# Patient Record
Sex: Female | Born: 1990
Health system: Southern US, Community
[De-identification: ages and names within clinical notes are randomized; demographics above are authoritative.]

## PROBLEM LIST (undated history)

## (undated) DIAGNOSIS — K429 Umbilical hernia without obstruction or gangrene: Secondary | ICD-10-CM

---

## 2016-04-08 ENCOUNTER — Emergency Department (HOSPITAL_BASED_OUTPATIENT_CLINIC_OR_DEPARTMENT_OTHER)
Admission: EM | Admit: 2016-04-08 | Discharge: 2016-04-08 | Disposition: A | Discharge status: Home / Self Care | Payer: Self-pay | Attending: Dermatology | Admitting: Dermatology

## 2016-04-08 ENCOUNTER — Encounter (HOSPITAL_BASED_OUTPATIENT_CLINIC_OR_DEPARTMENT_OTHER): Payer: Self-pay | Admitting: *Deleted

## 2016-04-08 DIAGNOSIS — M25512 Pain in left shoulder: Secondary | ICD-10-CM | POA: Insufficient documentation

## 2016-04-08 DIAGNOSIS — Z5321 Procedure and treatment not carried out due to patient leaving prior to being seen by health care provider: Secondary | ICD-10-CM | POA: Insufficient documentation

## 2016-04-08 NOTE — ED Triage Notes (Signed)
Pt c/o left shoulder pain w/o injury x 2 weeks, seen at [place] [facility] for same x 2

## 2016-04-08 NOTE — ED Notes (Signed)
No answer x 2 calls

## 2016-10-25 ENCOUNTER — Encounter: Payer: Self-pay | Admitting: *Deleted

## 2016-10-25 ENCOUNTER — Telehealth: Payer: Self-pay | Admitting: Family Medicine

## 2016-10-25 ENCOUNTER — Emergency Department (INDEPENDENT_AMBULATORY_CARE_PROVIDER_SITE_OTHER)
Admission: EM | Admit: 2016-10-25 | Discharge: 2016-10-25 | Disposition: A | Discharge status: Home / Self Care | Payer: PRIVATE HEALTH INSURANCE | Source: Home / Self Care | Attending: Family Medicine | Admitting: Family Medicine

## 2016-10-25 ENCOUNTER — Emergency Department (INDEPENDENT_AMBULATORY_CARE_PROVIDER_SITE_OTHER): Payer: PRIVATE HEALTH INSURANCE

## 2016-10-25 DIAGNOSIS — M62838 Other muscle spasm: Secondary | ICD-10-CM

## 2016-10-25 DIAGNOSIS — M62830 Muscle spasm of back: Secondary | ICD-10-CM

## 2016-10-25 DIAGNOSIS — G8929 Other chronic pain: Secondary | ICD-10-CM | POA: Diagnosis not present

## 2016-10-25 DIAGNOSIS — M25512 Pain in left shoulder: Secondary | ICD-10-CM

## 2016-10-25 DIAGNOSIS — M4182 Other forms of scoliosis, cervical region: Secondary | ICD-10-CM

## 2016-10-25 DIAGNOSIS — M249 Joint derangement, unspecified: Secondary | ICD-10-CM | POA: Insufficient documentation

## 2016-10-25 DIAGNOSIS — M899 Disorder of bone, unspecified: Secondary | ICD-10-CM | POA: Insufficient documentation

## 2016-10-25 DIAGNOSIS — M549 Dorsalgia, unspecified: Secondary | ICD-10-CM | POA: Diagnosis not present

## 2016-10-25 DIAGNOSIS — M542 Cervicalgia: Secondary | ICD-10-CM

## 2016-10-25 DIAGNOSIS — S43002A Unspecified subluxation of left shoulder joint, initial encounter: Secondary | ICD-10-CM | POA: Insufficient documentation
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
## 2016-10-25 NOTE — Telephone Encounter (Signed)
Seen patient briefly in urgent care.  Significant scapula dysfunction, Cspine spasm and left shoulder voluntary sublexation.  Plan for scheduling a formal visit with me and refer for PT.

## 2016-10-26 ENCOUNTER — Encounter: Payer: PRIVATE HEALTH INSURANCE | Admitting: Family Medicine

## 2016-10-26 DIAGNOSIS — Z0189 Encounter for other specified special examinations: Secondary | ICD-10-CM

## 2016-11-06 ENCOUNTER — Ambulatory Visit: Payer: PRIVATE HEALTH INSURANCE | Admitting: Rehabilitative and Restorative Service Providers"

## 2016-12-21 ENCOUNTER — Encounter (HOSPITAL_BASED_OUTPATIENT_CLINIC_OR_DEPARTMENT_OTHER): Payer: Self-pay

## 2016-12-21 ENCOUNTER — Emergency Department (HOSPITAL_BASED_OUTPATIENT_CLINIC_OR_DEPARTMENT_OTHER)
Admission: EM | Admit: 2016-12-21 | Discharge: 2016-12-21 | Discharge status: Against Medical Advice | Payer: PRIVATE HEALTH INSURANCE | Attending: Emergency Medicine | Admitting: Emergency Medicine

## 2016-12-21 DIAGNOSIS — H9202 Otalgia, left ear: Secondary | ICD-10-CM | POA: Insufficient documentation

## 2016-12-21 DIAGNOSIS — Z5320 Procedure and treatment not carried out because of patient's decision for unspecified reasons: Secondary | ICD-10-CM | POA: Insufficient documentation

## 2016-12-21 DIAGNOSIS — R42 Dizziness and giddiness: Secondary | ICD-10-CM | POA: Insufficient documentation

## 2016-12-21 NOTE — ED Notes (Signed)
Pt not in room when Dr. Eudelia Bunchardama went to assess. Pt left prior to RN assessment

## 2016-12-21 NOTE — ED Provider Notes (Signed)
[facility]   CSN: [id number] Arrival date & time: 12/21/16  1119     History   Chief Complaint Chief Complaint  Patient presents with  . Otalgia    HPI Mary Gripbresha Berka is a 26 y.o. female.  Patient is a 26 yo F here with L ear pain. Has had for past 2 days feels like there is "a bug or something to drain" and pain is constant. Has been using bobby pin and was getting some "stuff" out initially. No discharge or redness. No dental pain or jaw pain/popping. No fever/chills. No recent illness.      History reviewed. No pertinent past medical history.  Patient Active Problem List   Diagnosis Date Noted  . 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 Cervical paraspinal muscle spasm 10/25/2016    History reviewed. No pertinent surgical history.  OB History    No data available       Home Medications    Prior to Admission medications   Not on File    Family History Family History  Problem Relation Age of Onset  . Diabetes Mother   . COPD Maternal Grandmother   . Stomach cancer Maternal Grandfather     Social History Social History  Substance Use Topics  . Smoking status: Never Smoker  . Smokeless tobacco: Never Used  . Alcohol use No     Allergies   Patient has no known allergies.   Review of Systems Review of Systems  Constitutional: Negative for chills and fever.  HENT: Positive for ear pain (L). Negative for congestion, dental problem, ear discharge, mouth sores, rhinorrhea, sinus pain, sore throat, tinnitus and trouble swallowing.   Respiratory: Negative for shortness of breath.   Cardiovascular: Negative for chest pain.  Gastrointestinal: Negative for abdominal pain, nausea and vomiting.  Genitourinary: Negative for dysuria, frequency, hematuria and urgency.  Skin: Negative for rash.  Neurological: Positive for light-headedness. Negative for dizziness  and headaches.     Physical Exam Updated Vital Signs BP 116/86 (BP Location: Left Arm)   Pulse 82   Temp 98.8 F (37.1 C) (Oral)   Resp 16   Ht 5' (1.524 m)   Wt 49.4 kg (109 lb)   LMP 12/20/2016   SpO2 100%   BMI 21.29 kg/m   Physical Exam  Constitutional: She is oriented to person, place, and time. She appears well-developed and well-nourished. No distress.  HENT:  Head: Normocephalic and atraumatic.  Right Ear: External ear normal.  Left Ear: External ear normal.  Nose: Nose normal.  Mouth/Throat: Oropharynx is clear and moist. No oropharyngeal exudate.  TMs intact pearly gray with good light reflex bilaterally. No fluid or discharge. No erythema. L ear canal with 2  superficial abrasions.  Eyes: Pupils are equal, round, and reactive to light. Conjunctivae and EOM are normal.  Neck: Normal range of motion. Neck supple.  Cardiovascular: Normal rate, regular rhythm, normal heart sounds and intact distal pulses.   No murmur heard. Pulmonary/Chest: Effort normal and breath sounds normal. No respiratory distress.  Abdominal: Soft. Bowel sounds are normal. She exhibits no distension and no mass. There is no tenderness. There is no rebound and no guarding.  Musculoskeletal: Normal range of motion. She exhibits no edema.  Lymphadenopathy:    She has no cervical adenopathy.  Neurological: She is alert and oriented to person, place, and time.  Skin: Skin is warm and dry. Capillary refill takes  less than 2 seconds. Rash noted.  Psychiatric: She has a normal mood and affect.     ED Treatments / Results  Labs (all labs ordered are listed, but only abnormal results are displayed) Labs Reviewed - No data to display  EKG  EKG Interpretation None       Radiology No results found.  Procedures Procedures (including critical care time)  Medications Ordered in ED Medications - No data to display   Initial Impression / Assessment and Plan / ED Course  I have reviewed the  triage vital signs and the nursing notes.  Pertinent labs & imaging results that were available during my care of the patient were reviewed by me and considered in my medical decision making (see chart for details).   Patient is a 26 yo F here with L ear pain. Vitals stable and ear exam is unremarkable as per documentation above. Patient was adamant that her ear needed to be drained, discussed that nothing to drain based on my exam and that she should establish with a PCP. Asked patient to stay for evaluation by attending physician to which she verbally agreed but then she eloped. Discussed my findings with Dr. Eudelia Bunch who agreed with my assessment.    Final Clinical Impressions(s) / ED Diagnoses   Final diagnoses:  Otalgia of left ear    New Prescriptions New Prescriptions   No medications on file     Leland Her, DO 12/21/16 1239    Leland Her, DO 12/21/16 1240

## 2016-12-21 NOTE — ED Notes (Signed)
ED Provider at bedside. Dr. Artist PaisYoo (resident)

## 2016-12-21 NOTE — ED Provider Notes (Signed)
Patient eloped prior to me being able to see and examined the patient. I have reviewed the documentation on PMH/FH/Soc Hx. I have discussed the plan of care with the resident and agree based on her findings.  I have reviewed and agree with the resident's documentation. Please see associated encounter note.   EKG Interpretation None         Petar Mucci, Amadeo GarnetPedro Eduardo, MD 12/21/16 1444

## 2016-12-21 NOTE — ED Notes (Signed)
Pt seen leaving ed by registration staff. 

## 2016-12-21 NOTE — ED Triage Notes (Signed)
C/o left earache x 2 days-started after swimming-NAD-steady gait

## 2017-09-06 ENCOUNTER — Emergency Department (HOSPITAL_BASED_OUTPATIENT_CLINIC_OR_DEPARTMENT_OTHER)
Admission: EM | Admit: 2017-09-06 | Discharge: 2017-09-06 | Disposition: A | Discharge status: Home / Self Care | Payer: PRIVATE HEALTH INSURANCE | Attending: Emergency Medicine | Admitting: Emergency Medicine

## 2017-09-06 ENCOUNTER — Encounter (HOSPITAL_BASED_OUTPATIENT_CLINIC_OR_DEPARTMENT_OTHER): Payer: Self-pay | Admitting: Emergency Medicine

## 2017-09-06 ENCOUNTER — Emergency Department (HOSPITAL_BASED_OUTPATIENT_CLINIC_OR_DEPARTMENT_OTHER): Payer: PRIVATE HEALTH INSURANCE

## 2017-09-06 ENCOUNTER — Other Ambulatory Visit: Payer: Self-pay

## 2017-09-06 DIAGNOSIS — N12 Tubulo-interstitial nephritis, not specified as acute or chronic: Secondary | ICD-10-CM

## 2017-09-06 LAB — CBC WITH DIFFERENTIAL/PLATELET
BASOS ABS: 0 10*3/uL (ref 0.0–0.1)
Basophils Relative: 0 %
EOS PCT: 0 %
Eosinophils Absolute: 0 10*3/uL (ref 0.0–0.7)
HEMATOCRIT: 35.8 % — AB (ref 36.0–46.0)
HEMOGLOBIN: 12.9 g/dL (ref 12.0–15.0)
LYMPHS ABS: 1.3 10*3/uL (ref 0.7–4.0)
LYMPHS PCT: 13 %
MCH: 31.2 pg (ref 26.0–34.0)
MCHC: 36 g/dL (ref 30.0–36.0)
MCV: 86.5 fL (ref 78.0–100.0)
Monocytes Absolute: 1.8 10*3/uL — ABNORMAL HIGH (ref 0.1–1.0)
Monocytes Relative: 18 %
NEUTROS ABS: 7 10*3/uL (ref 1.7–7.7)
Neutrophils Relative %: 69 %
PLATELETS: 224 10*3/uL (ref 150–400)
RBC: 4.14 MIL/uL (ref 3.87–5.11)
RDW: 11.9 % (ref 11.5–15.5)
WBC: 10 10*3/uL (ref 4.0–10.5)

## 2017-09-06 LAB — COMPREHENSIVE METABOLIC PANEL
ALT: 89 U/L — ABNORMAL HIGH (ref 14–54)
ANION GAP: 10 (ref 5–15)
AST: 171 U/L — AB (ref 15–41)
Albumin: 3.5 g/dL (ref 3.5–5.0)
Alkaline Phosphatase: 96 U/L (ref 38–126)
BUN: 5 mg/dL — AB (ref 6–20)
CO2: 21 mmol/L — ABNORMAL LOW (ref 22–32)
Calcium: 8.7 mg/dL — ABNORMAL LOW (ref 8.9–10.3)
Chloride: 101 mmol/L (ref 101–111)
Creatinine, Ser: 0.81 mg/dL (ref 0.44–1.00)
GFR calc Af Amer: >60 mL/min (ref >60)
Glucose, Bld: 132 mg/dL — ABNORMAL HIGH (ref 65–99)
POTASSIUM: 3.4 mmol/L — AB (ref 3.5–5.1)
Sodium: 132 mmol/L — ABNORMAL LOW (ref 135–145)
TOTAL PROTEIN: 7.3 g/dL (ref 6.5–8.1)
Total Bilirubin: 0.8 mg/dL (ref 0.3–1.2)

## 2017-09-06 LAB — URINALYSIS, ROUTINE W REFLEX MICROSCOPIC
Bilirubin Urine: NEGATIVE
GLUCOSE, UA: 100 mg/dL — AB
Ketones, ur: NEGATIVE mg/dL
LEUKOCYTES UA: NEGATIVE
Nitrite: NEGATIVE
PH: 6 (ref 5.0–8.0)
Protein, ur: 100 mg/dL — AB
SPECIFIC GRAVITY, URINE: 1.025 (ref 1.005–1.030)

## 2017-09-06 LAB — URINALYSIS, MICROSCOPIC (REFLEX)

## 2017-09-06 LAB — RAPID STREP SCREEN (MED CTR MEBANE ONLY): Streptococcus, Group A Screen (Direct): NEGATIVE

## 2017-09-06 LAB — I-STAT CG4 LACTIC ACID, ED
LACTIC ACID, VENOUS: 1.96 mmol/L — AB (ref 0.5–1.9)
LACTIC ACID, VENOUS: 1.03 mmol/L (ref 0.5–1.9)

## 2017-09-06 LAB — PREGNANCY, URINE: Preg Test, Ur: NEGATIVE

## 2017-09-06 MED ORDER — SODIUM CHLORIDE 0.9 % IJ SOLN
5.00 | INTRAMUSCULAR | Status: DC
Start: ? — End: 2017-09-06

## 2017-09-06 MED ORDER — SODIUM CHLORIDE 0.9 % IV BOLUS
1000.0000 mL | Freq: Once | INTRAVENOUS | Status: AC
  Administered 2017-09-06: 1000 mL via INTRAVENOUS

## 2017-09-06 MED ORDER — MORPHINE SULFATE (PF) 4 MG/ML IV SOLN
4.0000 mg | Freq: Once | INTRAVENOUS | Status: AC
  Administered 2017-09-06: 4 mg via INTRAVENOUS
  Filled 2017-09-06: qty 1

## 2017-09-06 MED ORDER — SODIUM CHLORIDE 0.9 % IV SOLN
2.0000 g | Freq: Once | INTRAVENOUS | Status: AC
  Administered 2017-09-06: 2 g via INTRAVENOUS
  Filled 2017-09-06: qty 20

## 2017-09-06 MED ORDER — PROMETHAZINE HCL 25 MG/ML IJ SOLN
12.5000 mg | Freq: Once | INTRAMUSCULAR | Status: AC
  Administered 2017-09-06: 12.5 mg via INTRAVENOUS
  Filled 2017-09-06: qty 1

## 2017-09-06 MED ORDER — CEPHALEXIN 500 MG PO CAPS: 500.0000 mg | ORAL_CAPSULE | Freq: Four times a day (QID) | ORAL | 0 refills | Status: AC

## 2017-09-06 MED ORDER — KETOROLAC TROMETHAMINE 30 MG/ML IJ SOLN
30.0000 mg | Freq: Once | INTRAMUSCULAR | Status: AC
  Administered 2017-09-06: 30 mg via INTRAVENOUS
  Filled 2017-09-06: qty 1

## 2017-09-06 MED ORDER — PROMETHAZINE HCL 12.5 MG PO TABS: 25.0000 mg | ORAL_TABLET | Freq: Three times a day (TID) | ORAL | 0 refills | Status: DC | PRN

## 2017-09-06 NOTE — ED Triage Notes (Signed)
LLQ abd pain, headache, n/v, and fever x5 days. Unable to keep anything on her stomcah.  Was seen at [facility] 5 days ago.

## 2017-09-06 NOTE — ED Notes (Signed)
Patient transported to X-ray 

## 2017-09-06 NOTE — ED Notes (Signed)
ED Provider at bedside. 

## 2017-09-06 NOTE — Discharge Instructions (Signed)
Please take Phenergen as needed for nausea and vomiting Please use ibuprofen and tylenol as needed for fever and body aches.  Please take keflex as prescribed. Please take all of your antibiotics until finished!   You may develop abdominal discomfort or diarrhea from the antibiotic.  You may help offset this with probiotics which you can buy or get in yogurt. Do not eat or take the probiotics until 2 hours after your antibiotic. Do not take your medicine if develop an itchy rash, swelling in your mouth or lips, or difficulty breathing.  Please follow up with your PCP this week for further evaluation.  Return if you continue to have vomiting despite anti-nausea medication.  If you develop worsening or new concerning symptoms you can return to the emergency department for re-evaluation.

## 2017-09-06 NOTE — ED Provider Notes (Signed)
[facility] EMERGENCY DEPARTMENT Provider Note   CSN: [id number] Arrival date & time: 09/06/17  1046     History   Chief Complaint Chief Complaint  Patient presents with  . Fever    HPI Mary Peters is a 27 y.o. female who presents the emergency department today for fever x 5 days ago.  Patient states that over the last 5 days she has been having fever, chills, generalized body aches, abdominal pain, urinary symptoms as well as nausea and vomiting.  She notes that her pain is in her left flank and radiates to her left upper abdomen as well as her left lower abdomen.  She reports that this pain waxes and waning throughout the day.  She notes that she has constant nausea and typically has approximately 3 episodes of nonbilious, nonbloody emesis per day.  She notes she has not been able to tolerate fluids over the last several days.  She reports that she has been having increased urinary frequency has since the onset of her symptoms.  She notes that every time she vomits she gets pain in her bilateral temporal area that she describes as a headache but resolves shortly after.  She was seen at [facility] yesterday where she had a extensive work-up done including blood work, urinalysis, CT of the head as well as CT of the abdomen.  She was discharged home with Fioricet and Zofran.  She notes that she has been taking the Zofran for her symptoms but has continued nausea and emesis.  She has not been able to take anything for her fever.  She notes she has had continued symptoms.  Patient denies any URI symptoms, chest pain, shortness of breath.  Abdominal Pain -pertinent negatives Patient denies any bloody or bilious emesis.  No diarrhea, melena, hematochezia, dysuria, hematuria, urinary urgency.  Last bowel movement today normal.  No previous abdominal surgeries.  Patient states she has not been sexually active in the last 6 months.  No vaginal discharge or bleeding.  No pelvic  pain.  Headache pertinent negatives Patient denies any syncope, visual changes, inability to range the neck, rash, or thunderclap onset.  HPI  History reviewed. No pertinent past medical history.  Patient Active Problem List   Diagnosis Date Noted  . 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 Cervical paraspinal muscle spasm 10/25/2016    History reviewed. No pertinent surgical history.   OB History   None      Home Medications    Prior to Admission medications   Not on File    Family History Family History  Problem Relation Age of Onset  . Diabetes Mother   . COPD Maternal Grandmother   . Stomach cancer Maternal Grandfather     Social History Social History   Tobacco Use  . Smoking status: Never Smoker  . Smokeless tobacco: Never Used  Substance Use Topics  . Alcohol use: No  . Drug use: No     Allergies   Patient has no known allergies.   Review of Systems Review of Systems  All other systems reviewed and are negative.    Physical Exam Updated Vital Signs BP 116/75 (BP Location: Right Arm)   Pulse (!) 102   Temp (!) 103.2 F (39.6 C) (Oral)   Resp 18   Ht 5' (1.524 m)   Wt 51.3 kg (113 lb)   LMP 08/24/2017   SpO2 100%   BMI  22.07 kg/m   Physical Exam  Constitutional: She appears well-developed and well-nourished.  Nontoxic-appearing  HENT:  Head: Normocephalic and atraumatic.  Right Ear: External ear normal.  Left Ear: External ear normal.  Nose: Nose normal.  Mouth/Throat: Uvula is midline, oropharynx is clear and moist and mucous membranes are normal. No tonsillar exudate.  The patient has normal phonation and is in control of secretions. No stridor.  Midline uvula without edema. Soft palate rises symmetrically.  No tonsillar erythema or exudates. No PTA. Tongue protrusion is normal. No trismus. No creptius on neck palpation and patient has good dentition. No  gingival erythema or fluctuance noted. Mucus membranes moist.   Eyes: Pupils are equal, round, and reactive to light. Right eye exhibits no discharge. Left eye exhibits no discharge. No scleral icterus. Right eye exhibits normal extraocular motion and no nystagmus. Left eye exhibits normal extraocular motion and no nystagmus.  Neck: Trachea normal. Neck supple. No spinous process tenderness present. No neck rigidity. Normal range of motion present.  No nuchal rigidity or meningismus. Full rom of the neck without difficulty.   Cardiovascular: Normal rate, regular rhythm and intact distal pulses.  No murmur heard. Pulses:      Radial pulses are 2+ on the right side, and 2+ on the left side.       Dorsalis pedis pulses are 2+ on the right side, and 2+ on the left side.       Posterior tibial pulses are 2+ on the right side, and 2+ on the left side.  No lower extremity swelling or edema. Calves symmetric in size bilaterally.  Pulmonary/Chest: Effort normal and breath sounds normal. She exhibits no tenderness.  Patient satting at 100% on room air. No increased work of breathing. No accessory muscle use. Patient is sitting upright, speaking in full sentences without difficulty   Abdominal: Soft. Bowel sounds are normal. She exhibits no distension and no pulsatile liver. There is tenderness in the left upper quadrant and left lower quadrant. There is CVA tenderness (left). There is no rigidity, no rebound, no guarding, no tenderness at McBurney's point and negative Murphy's sign.  Musculoskeletal: She exhibits no edema.  Lymphadenopathy:    She has no cervical adenopathy.  Neurological: She is alert.  Mental Status:  Alert, oriented, thought content appropriate, able to give a coherent history. Speech fluent without evidence of aphasia. Able to follow 2 step commands without difficulty.  Cranial Nerves:  II:  Peripheral visual fields grossly normal, pupils equal, round, reactive to light III,IV, VI:  ptosis not present, extra-ocular motions intact bilaterally  V,VII: smile symmetric, eyebrows raise symmetric, facial light touch sensation equal VIII: hearing grossly normal to voice  X: uvula elevates symmetrically  XI: bilateral shoulder shrug symmetric and strong XII: midline tongue extension without fassiculations Motor:  Normal tone. 5/5 in upper and lower extremities bilaterally including strong and equal grip strength and dorsiflexion/plantar flexion Sensory: Sensation intact to light touch in all extremities. Negative Romberg.  Deep Tendon Reflexes: 2+ and symmetric in the biceps and patella Cerebellar: normal finger-to-nose with bilateral upper extremities. Normal heel-to -shin balance bilaterally of the lower extremity. No pronator drift.  CV: distal pulses palpable throughout   Skin: Skin is warm and dry. No rash noted. She is not diaphoretic.  No ocular papular rash, no petechiae, no purpura, no blisters, no pustules, no warmth, no draining sinus tracts, no superficial abscesses, no bullous impetigo, no vesicles, no desquamation, no target lesions with dusky purpura or a central  bulla.   Psychiatric: She has a normal mood and affect.  Nursing note and vitals reviewed.    ED Treatments / Results  Labs (all labs ordered are listed, but only abnormal results are displayed) Labs Reviewed  COMPREHENSIVE METABOLIC PANEL - Abnormal; Notable for the following components:      Result Value   Sodium 132 (*)    Potassium 3.4 (*)    CO2 21 (*)    Glucose, Bld 132 (*)    BUN 5 (*)    Calcium 8.7 (*)    AST 171 (*)    ALT 89 (*)    All other components within normal limits  CBC WITH DIFFERENTIAL/PLATELET - Abnormal; Notable for the following components:   HCT 35.8 (*)    Monocytes Absolute 1.8 (*)    All other components within normal limits  URINALYSIS, ROUTINE W REFLEX MICROSCOPIC - Abnormal; Notable for the following components:   APPearance CLOUDY (*)    Glucose, UA 100 (*)     Hgb urine dipstick SMALL (*)    Protein, ur 100 (*)    All other components within normal limits  URINALYSIS, MICROSCOPIC (REFLEX) - Abnormal; Notable for the following components:   Bacteria, UA FEW (*)    All other components within normal limits  I-STAT CG4 LACTIC ACID, ED - Abnormal; Notable for the following components:   Lactic Acid, Venous 1.96 (*)    All other components within normal limits  RAPID STREP SCREEN ([facility] ONLY)  CULTURE, GROUP A STREP ([facility])  URINE CULTURE  PREGNANCY, URINE  I-STAT CG4 LACTIC ACID, ED    EKG None  Radiology Dg Chest 2 View  Result Date: 09/06/2017 CLINICAL DATA:  Left lower quadrant abdominal pain, headache, nausea and vomiting, and fever for 5 days. EXAM: CHEST - 2 VIEW COMPARISON:  The cardiomediastinal silhouette is within normal limits. The lungs are well inflated and clear. There is no evidence of pleural effusion or pneumothorax. No acute osseous abnormality is identified. FINDINGS: The heart size and mediastinal contours are within normal limits. Both lungs are clear. The visualized skeletal structures are unremarkable. IMPRESSION: No active cardiopulmonary disease. Electronically Signed   By: Sebastian Ache M.D.   On: 09/06/2017 11:16    Procedures Procedures (including critical care time)  Medications Ordered in ED Medications  morphine 4 MG/ML injection 4 mg (4 mg Intravenous Given 09/06/17 1146)  ketorolac (TORADOL) 30 MG/ML injection 30 mg (30 mg Intravenous Given 09/06/17 1143)  sodium chloride 0.9 % bolus 1,000 mL (0 mLs Intravenous Stopped 09/06/17 1235)  promethazine (PHENERGAN) injection 12.5 mg (12.5 mg Intravenous Given 09/06/17 1142)  sodium chloride 0.9 % bolus 1,000 mL (0 mLs Intravenous Stopped 09/06/17 1403)  cefTRIAXone (ROCEPHIN) 2 g in sodium chloride 0.9 % 100 mL IVPB (0 g Intravenous Stopped 09/06/17 1232)     Initial Impression / Assessment and Plan / ED Course  I have reviewed the triage vital signs and the nursing  notes.  Pertinent labs & imaging results that were available during my care of the patient were reviewed by me and considered in my medical decision making (see chart for details).     27 year old female that presents with fever, left flank pain that radiates into her left lower abdomen, urinary frequency, nausea and vomiting over the last several days.  Patient was seen at [facility] yesterday where she had a CT scan done that shows pyelonephritis.  Urine did show possible UTI.  She was  not discharged home on antibiotics.  She was discharged home with Zofran.  Patient is presenting because she has had ongoing nausea and vomiting despite Zofran therapy.  She is noted to be febrile and tachycardic on presentation.  Patient does not appear septic.  Lactic acid 1.96.  Likely liver related to ongoing nausea and vomiting.  His work-up is consistent with pyelonephritis.  She was given IV fluids and nausea medication in the department with significant improvement of her symptoms.  Vital signs improved after IV fluids and Toradol.  Patient is tolerating p.o. fluids.  Urine sent for culture.  She was given dose of IV Rocephin in the department.  Do not feel there is any other emergent process that would require ongoing work-up or inpatient treatments at this time.  Patient will be discharged home on 7-day course of Keflex.  Will give Phenergan for nausea and the patient not to take Zofran at the same time as this.  Recommended ibuprofen and Tylenol as needed for fever and body aches. . I advised the patient to follow-up with PCP this week. Specific return precautions discussed. Time was given for all questions to be answered. The patient verbalized understanding and agreement with plan. The patient appears safe for discharge home.  Patient case discussed with Dr. Patria Mane who is in agreement with plan.  Vitals:   09/06/17 1054 09/06/17 1055 09/06/17 1234 09/06/17 1402  BP: 116/75  101/68   Pulse: (!) 102   98   Resp: 18  16   Temp: (!) 103.2 F (39.6 C)  100.1 F (37.8 C) 98.7 F (37.1 C)  TempSrc: Oral  Oral Oral  SpO2: 100%  99%   Weight:  51.3 kg (113 lb)    Height:  5' (1.524 m)      Final Clinical Impressions(s) / ED Diagnoses   Final diagnoses:  Pyelonephritis    ED Discharge Orders        Ordered    promethazine (PHENERGAN) 12.5 MG tablet  Every 8 hours PRN     09/06/17 1354    cephALEXin (KEFLEX) 500 MG capsule  4 times daily     09/06/17 1354       Princella Pellegrini 09/06/17 1432    Azalia Bilis, MD 09/06/17 1701

## 2017-09-07 LAB — URINE CULTURE: Culture: <10000 — AB

## 2017-09-08 LAB — CULTURE, GROUP A STREP (THRC)

## 2019-07-04 ENCOUNTER — Encounter (HOSPITAL_BASED_OUTPATIENT_CLINIC_OR_DEPARTMENT_OTHER): Payer: Self-pay

## 2019-07-04 ENCOUNTER — Other Ambulatory Visit: Payer: Self-pay

## 2019-07-04 ENCOUNTER — Emergency Department (HOSPITAL_BASED_OUTPATIENT_CLINIC_OR_DEPARTMENT_OTHER)
Admission: EM | Admit: 2019-07-04 | Discharge: 2019-07-04 | Disposition: A | Discharge status: Home / Self Care | Payer: PRIVATE HEALTH INSURANCE | Attending: Emergency Medicine | Admitting: Emergency Medicine

## 2019-07-04 DIAGNOSIS — N939 Abnormal uterine and vaginal bleeding, unspecified: Secondary | ICD-10-CM | POA: Insufficient documentation

## 2019-07-04 DIAGNOSIS — R110 Nausea: Secondary | ICD-10-CM

## 2019-07-04 DIAGNOSIS — R112 Nausea with vomiting, unspecified: Secondary | ICD-10-CM | POA: Insufficient documentation

## 2019-07-04 LAB — COMPREHENSIVE METABOLIC PANEL
ALT: 14 U/L (ref 0–44)
AST: 20 U/L (ref 15–41)
Albumin: 4.3 g/dL (ref 3.5–5.0)
Alkaline Phosphatase: 46 U/L (ref 38–126)
Anion gap: 10 (ref 5–15)
BUN: 8 mg/dL (ref 6–20)
CO2: 24 mmol/L (ref 22–32)
Calcium: 9 mg/dL (ref 8.9–10.3)
Chloride: 108 mmol/L (ref 98–111)
Creatinine, Ser: 0.7 mg/dL (ref 0.44–1.00)
GFR calc Af Amer: >60 mL/min (ref >60)
GFR calc non Af Amer: >60 mL/min (ref >60)
Glucose, Bld: 129 mg/dL — ABNORMAL HIGH (ref 70–99)
Potassium: 3.2 mmol/L — ABNORMAL LOW (ref 3.5–5.1)
Sodium: 142 mmol/L (ref 135–145)
Total Bilirubin: 0.2 mg/dL — ABNORMAL LOW (ref 0.3–1.2)
Total Protein: 7.6 g/dL (ref 6.5–8.1)

## 2019-07-04 LAB — CBC WITH DIFFERENTIAL/PLATELET
Abs Immature Granulocytes: 0.04 10*3/uL (ref 0.00–0.07)
Basophils Absolute: 0 10*3/uL (ref 0.0–0.1)
Basophils Relative: 0 %
Eosinophils Absolute: 0.1 10*3/uL (ref 0.0–0.5)
Eosinophils Relative: 1 %
HCT: 42.2 % (ref 36.0–46.0)
Hemoglobin: 13.6 g/dL (ref 12.0–15.0)
Immature Granulocytes: 1 %
Lymphocytes Relative: 19 %
Lymphs Abs: 1.7 10*3/uL (ref 0.7–4.0)
MCH: 29.6 pg (ref 26.0–34.0)
MCHC: 32.2 g/dL (ref 30.0–36.0)
MCV: 91.7 fL (ref 80.0–100.0)
Monocytes Absolute: 0.6 10*3/uL (ref 0.1–1.0)
Monocytes Relative: 6 %
Neutro Abs: 6.2 10*3/uL (ref 1.7–7.7)
Neutrophils Relative %: 73 %
Platelets: 280 10*3/uL (ref 150–400)
RBC: 4.6 MIL/uL (ref 3.87–5.11)
RDW: 12.9 % (ref 11.5–15.5)
WBC: 8.6 10*3/uL (ref 4.0–10.5)
nRBC: 0 % (ref 0.0–0.2)

## 2019-07-04 LAB — URINALYSIS, ROUTINE W REFLEX MICROSCOPIC
Bilirubin Urine: NEGATIVE
Glucose, UA: NEGATIVE mg/dL
Ketones, ur: NEGATIVE mg/dL
Leukocytes,Ua: NEGATIVE
Nitrite: NEGATIVE
Protein, ur: NEGATIVE mg/dL
Specific Gravity, Urine: >1.03 — ABNORMAL HIGH (ref 1.005–1.030)
pH: 6 (ref 5.0–8.0)

## 2019-07-04 LAB — URINALYSIS, MICROSCOPIC (REFLEX)

## 2019-07-04 LAB — RAPID URINE DRUG SCREEN, HOSP PERFORMED
Amphetamines: NOT DETECTED
Barbiturates: NOT DETECTED
Benzodiazepines: NOT DETECTED
Cocaine: NOT DETECTED
Opiates: NOT DETECTED
Tetrahydrocannabinol: POSITIVE — AB

## 2019-07-04 LAB — PREGNANCY, URINE: Preg Test, Ur: NEGATIVE

## 2019-07-04 LAB — LIPASE, BLOOD: Lipase: 31 U/L (ref 11–51)

## 2019-07-04 MED ORDER — ONDANSETRON HCL 4 MG PO TABS: 4.0000 mg | ORAL_TABLET | Freq: Four times a day (QID) | ORAL | 0 refills | Status: AC

## 2019-07-04 MED ORDER — POTASSIUM CHLORIDE 10 MEQ/100ML IV SOLN
10.0000 meq | Freq: Once | INTRAVENOUS | Status: AC
  Administered 2019-07-04: 08:00:00 10 meq via INTRAVENOUS
  Filled 2019-07-04: qty 100

## 2019-07-04 MED ORDER — PROCHLORPERAZINE EDISYLATE 10 MG/2ML IJ SOLN: 10.0000 mg | Freq: Once | INTRAMUSCULAR | Status: DC

## 2019-07-04 MED ORDER — SODIUM CHLORIDE 0.9 % IV BOLUS
1000.0000 mL | Freq: Once | INTRAVENOUS | Status: AC
  Administered 2019-07-04: 08:00:00 1000 mL via INTRAVENOUS

## 2019-07-04 MED ORDER — FENTANYL CITRATE (PF) 100 MCG/2ML IJ SOLN
50.0000 ug | Freq: Once | INTRAMUSCULAR | Status: AC
  Administered 2019-07-04: 08:00:00 50 ug via INTRAVENOUS
  Filled 2019-07-04: qty 2

## 2019-07-04 MED ORDER — ONDANSETRON HCL 4 MG/2ML IJ SOLN
4.0000 mg | Freq: Once | INTRAMUSCULAR | Status: AC
  Administered 2019-07-04: 08:00:00 4 mg via INTRAVENOUS
  Filled 2019-07-04: qty 2

## 2019-07-04 MED ORDER — DIPHENHYDRAMINE HCL 50 MG/ML IJ SOLN: 25.0000 mg | Freq: Once | INTRAMUSCULAR | Status: DC

## 2019-07-04 MED ORDER — SODIUM CHLORIDE 0.9 % IV BOLUS: 1000.0000 mL | Freq: Once | INTRAVENOUS | Status: DC

## 2019-07-04 MED FILL — ONDANSETRON HCL 4 MG TABLET: 4 | 5 days supply | Qty: 20 | Fill #0

## 2019-07-04 NOTE — Discharge Instructions (Addendum)
Continue Zofran as needed for nausea and vomiting.  Continue to take Tylenol Motrin for pain.  Stay hydrated.  Keep a simple diet and avoid fatty and greasy foods.  Return if symptoms worsen.

## 2019-07-04 NOTE — ED Notes (Signed)
ED Provider at bedside. 

## 2019-07-04 NOTE — ED Provider Notes (Signed)
[facility] EMERGENCY DEPARTMENT Provider Note   CSN: [id number] Arrival date & time: 07/04/19  8850     History Chief Complaint  Patient presents with  . Abdominal Pain    Mary Peters is a 29 y.o. female.  The history is provided by the patient.  Abdominal Pain Pain location:  Generalized Pain quality: aching and cramping   Pain radiates to:  Does not radiate Pain severity:  Mild Onset quality:  Gradual Timing:  Constant Progression:  Unchanged Chronicity:  New Context: not previous surgeries, not recent illness, not sick contacts and not suspicious food intake   Relieved by:  Nothing Worsened by:  Nothing Associated symptoms: nausea (with blood in vomit), vaginal bleeding (started menstrual cycle yesterday) and vomiting   Associated symptoms: no chest pain, no chills, no cough, no dysuria, no fever, no hematuria, no shortness of breath, no sore throat and no vaginal discharge   Risk factors: has not had multiple surgeries and not pregnant        History reviewed. No pertinent past medical history.  Patient Active Problem List   Diagnosis Date Noted  . 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 Cervical paraspinal muscle spasm 10/25/2016    History reviewed. No pertinent surgical history.   OB History   No obstetric history on file.     Family History  Problem Relation Age of Onset  . Diabetes Mother   . COPD Maternal Grandmother   . Stomach cancer Maternal Grandfather     Social History   Tobacco Use  . Smoking status: Never Smoker  . Smokeless tobacco: Never Used  Substance Use Topics  . Alcohol use: No  . Drug use: No    Home Medications Prior to Admission medications   Medication Sig Start Date End Date Taking? Authorizing Provider  ondansetron (ZOFRAN) 4 MG tablet Take 1 tablet (4 mg total) by mouth every 6 (six) hours for 20 doses. 07/04/19 07/09/19  Tishawn Friedhoff,  Martiza Speth, DO  promethazine (PHENERGAN) 12.5 MG tablet Take 2 tablets (25 mg total) by mouth every 8 (eight) hours as needed for nausea or vomiting. 09/06/17   Maczis, Elmer Sow, PA-C    Allergies    Patient has no known allergies.  Review of Systems   Review of Systems  Constitutional: Negative for chills and fever.  HENT: Negative for ear pain and sore throat.   Eyes: Negative for pain and visual disturbance.  Respiratory: Negative for cough and shortness of breath.   Cardiovascular: Negative for chest pain and palpitations.  Gastrointestinal: Positive for abdominal pain, nausea (with blood in vomit) and vomiting.  Genitourinary: Positive for vaginal bleeding (started menstrual cycle yesterday). Negative for dysuria, hematuria, vaginal discharge and vaginal pain.  Musculoskeletal: Negative for arthralgias and back pain.  Skin: Negative for color change and rash.  Neurological: Negative for seizures and syncope.  All other systems reviewed and are negative.   Physical Exam Updated Vital Signs  ED Triage Vitals  Enc Vitals Group     BP 07/04/19 0724 (!) 138/103     Pulse Rate 07/04/19 0724 75     Resp 07/04/19 0724 20     Temp 07/04/19 0724 98.3 F (36.8 C)     Temp Source 07/04/19 0724 Oral     SpO2 07/04/19 0724 98 %     Weight 07/04/19 0725 110 lb (49.9 kg)     Height 07/04/19 0725 5' (1.524 m)  Head Circumference --      Peak Flow --      Pain Score 07/04/19 0725 10     Pain Loc --      Pain Edu? --      Excl. in GC? --     Physical Exam Vitals and nursing note reviewed.  Constitutional:      General: She is not in acute distress.    Appearance: She is well-developed. She is not ill-appearing.  HENT:     Head: Normocephalic and atraumatic.     Nose: Nose normal.     Mouth/Throat:     Mouth: Mucous membranes are moist.  Eyes:     Extraocular Movements: Extraocular movements intact.     Conjunctiva/sclera: Conjunctivae normal.     Pupils: Pupils are equal,  round, and reactive to light.  Cardiovascular:     Rate and Rhythm: Normal rate and regular rhythm.     Pulses: Normal pulses.     Heart sounds: Normal heart sounds. No murmur.  Pulmonary:     Effort: Pulmonary effort is normal. No respiratory distress.     Breath sounds: Normal breath sounds.  Abdominal:     General: There is no distension.     Palpations: Abdomen is soft.     Tenderness: There is generalized abdominal tenderness. There is no right CVA tenderness, left CVA tenderness, guarding or rebound.  Musculoskeletal:     Cervical back: Neck supple.  Skin:    General: Skin is warm and dry.  Neurological:     General: No focal deficit present.     Mental Status: She is alert.     ED Results / Procedures / Treatments   Labs (all labs ordered are listed, but only abnormal results are displayed) Labs Reviewed  COMPREHENSIVE METABOLIC PANEL - Abnormal; Notable for the following components:      Result Value   Potassium 3.2 (*)    Glucose, Bld 129 (*)    Total Bilirubin 0.2 (*)    All other components within normal limits  URINALYSIS, ROUTINE W REFLEX MICROSCOPIC - Abnormal; Notable for the following components:   Specific Gravity, Urine >1.030 (*)    Hgb urine dipstick MODERATE (*)    All other components within normal limits  RAPID URINE DRUG SCREEN, HOSP PERFORMED - Abnormal; Notable for the following components:   Tetrahydrocannabinol POSITIVE (*)    All other components within normal limits  URINALYSIS, MICROSCOPIC (REFLEX) - Abnormal; Notable for the following components:   Bacteria, UA FEW (*)    All other components within normal limits  LIPASE, BLOOD  CBC WITH DIFFERENTIAL/PLATELET  PREGNANCY, URINE    EKG None  Radiology No results found.  Procedures Procedures (including critical care time)  Medications Ordered in ED Medications  potassium chloride 10 mEq in 100 mL IVPB (10 mEq Intravenous New Bag/Given 07/04/19 0828)  sodium chloride 0.9 % bolus  1,000 mL ( Intravenous Stopped 07/04/19 0848)  ondansetron (ZOFRAN) injection 4 mg (4 mg Intravenous Given 07/04/19 0746)  fentaNYL (SUBLIMAZE) injection 50 mcg (50 mcg Intravenous Given 07/04/19 0747)    ED Course  I have reviewed the triage vital signs and the nursing notes.  Pertinent labs & imaging results that were available during my care of the patient were reviewed by me and considered in my medical decision making (see chart for details).    MDM Rules/Calculators/A&P  Brittin Belnap is a 29 year old female with no significant medical history who presents to the emergency department with abdominal pain, nausea, vomiting.  Patient with unremarkable vitals.  No fever.  Symptoms started last night.  Patient with some cramping and diffuse abdominal pain with nausea and vomiting.  Possibly some blood within the vomit.  Denies any alcohol or drug use.  Started menstrual cycle yesterday.  Otherwise denies any vaginal discharge.  Denies any trauma or suspicious food intake.  Has some mild abdominal pain throughout but no focal tenderness on exam.  Denies any urinary symptoms.  However will check lab work, urinalysis.  Will give IV fluids, IV Zofran, IV fentanyl and reevaluate.  Low concern for appendicitis or bowel obstruction at this time.  Suspect possibly viral process or foodborne process.  Patient feels improved following fluids, antiemetics and pain medicine.  No significant anemia, electrolyte abnormality, kidney injury.  No urinary tract infection.  Gallbladder and liver enzymes within normal limits.  Doubt cholecystitis.  Repeat abdominal exam is unremarkable.  No focal pain on exam to suggest intra-abdominal process such as appendicitis or bowel obstruction.  Suspect viral process versus foodborne process.  Possibly related to menstrual cycle.  UDS also positive for THC and possibly an aspect of hyperemesis from [facility].  Will prescribe Zofran.  Given return precautions and  discharged from the ED in good condition.  This chart was dictated using voice recognition software.  Despite best efforts to proofread,  errors can occur which can change the documentation meaning.    Final Clinical Impression(s) / ED Diagnoses Final diagnoses:  Nausea    Rx / DC Orders ED Discharge Orders         Ordered    ondansetron (ZOFRAN) 4 MG tablet  Every 6 hours     07/04/19 0925           Lennice Sites, DO 07/04/19 [phone]

## 2019-07-04 NOTE — ED Triage Notes (Signed)
Pt arrives with c/o abdominal pain, states she did start her menstrual cycle yesterday but reports this pain is worse than her normal. Also reports vomiting blood starting yesterday. Pt reports about 10 episodes of vomiting. Denies any medication at home.

## 2019-09-13 ENCOUNTER — Encounter (HOSPITAL_BASED_OUTPATIENT_CLINIC_OR_DEPARTMENT_OTHER): Payer: Self-pay

## 2019-09-13 ENCOUNTER — Emergency Department (HOSPITAL_BASED_OUTPATIENT_CLINIC_OR_DEPARTMENT_OTHER)
Admission: EM | Admit: 2019-09-13 | Discharge: 2019-09-13 | Disposition: A | Discharge status: Home / Self Care | Payer: Self-pay | Attending: Emergency Medicine | Admitting: Emergency Medicine

## 2019-09-13 ENCOUNTER — Other Ambulatory Visit: Payer: Self-pay

## 2019-09-13 DIAGNOSIS — R1012 Left upper quadrant pain: Secondary | ICD-10-CM | POA: Insufficient documentation

## 2019-09-13 DIAGNOSIS — R112 Nausea with vomiting, unspecified: Secondary | ICD-10-CM | POA: Insufficient documentation

## 2019-09-13 LAB — RAPID URINE DRUG SCREEN, HOSP PERFORMED
Amphetamines: NOT DETECTED
Barbiturates: NOT DETECTED
Benzodiazepines: NOT DETECTED
Cocaine: NOT DETECTED
Opiates: NOT DETECTED
Tetrahydrocannabinol: POSITIVE — AB

## 2019-09-13 LAB — CBC WITH DIFFERENTIAL/PLATELET
Abs Immature Granulocytes: 0.03 10*3/uL (ref 0.00–0.07)
Basophils Absolute: 0 10*3/uL (ref 0.0–0.1)
Basophils Relative: 0 %
Eosinophils Absolute: 0 10*3/uL (ref 0.0–0.5)
Eosinophils Relative: 0 %
HCT: 36.9 % (ref 36.0–46.0)
Hemoglobin: 12.2 g/dL (ref 12.0–15.0)
Immature Granulocytes: 0 %
Lymphocytes Relative: 14 %
Lymphs Abs: 1.1 10*3/uL (ref 0.7–4.0)
MCH: 29.5 pg (ref 26.0–34.0)
MCHC: 33.1 g/dL (ref 30.0–36.0)
MCV: 89.1 fL (ref 80.0–100.0)
Monocytes Absolute: 0.5 10*3/uL (ref 0.1–1.0)
Monocytes Relative: 6 %
Neutro Abs: 6.6 10*3/uL (ref 1.7–7.7)
Neutrophils Relative %: 80 %
Platelets: 275 10*3/uL (ref 150–400)
RBC: 4.14 MIL/uL (ref 3.87–5.11)
RDW: 12.9 % (ref 11.5–15.5)
WBC: 8.2 10*3/uL (ref 4.0–10.5)
nRBC: 0 % (ref 0.0–0.2)

## 2019-09-13 LAB — COMPREHENSIVE METABOLIC PANEL
ALT: 15 U/L (ref 0–44)
AST: 19 U/L (ref 15–41)
Albumin: 4.2 g/dL (ref 3.5–5.0)
Alkaline Phosphatase: 39 U/L (ref 38–126)
Anion gap: 9 (ref 5–15)
BUN: 5 mg/dL — ABNORMAL LOW (ref 6–20)
CO2: 23 mmol/L (ref 22–32)
Calcium: 8.3 mg/dL — ABNORMAL LOW (ref 8.9–10.3)
Chloride: 108 mmol/L (ref 98–111)
Creatinine, Ser: 0.68 mg/dL (ref 0.44–1.00)
GFR calc Af Amer: >60 mL/min (ref >60)
GFR calc non Af Amer: >60 mL/min (ref >60)
Glucose, Bld: 120 mg/dL — ABNORMAL HIGH (ref 70–99)
Potassium: 3.1 mmol/L — ABNORMAL LOW (ref 3.5–5.1)
Sodium: 140 mmol/L (ref 135–145)
Total Bilirubin: 0.5 mg/dL (ref 0.3–1.2)
Total Protein: 7.1 g/dL (ref 6.5–8.1)

## 2019-09-13 LAB — URINALYSIS, ROUTINE W REFLEX MICROSCOPIC
Bilirubin Urine: NEGATIVE
Glucose, UA: NEGATIVE mg/dL
Hgb urine dipstick: NEGATIVE
Ketones, ur: 15 mg/dL — AB
Leukocytes,Ua: NEGATIVE
Nitrite: NEGATIVE
Protein, ur: NEGATIVE mg/dL
Specific Gravity, Urine: 1.025 (ref 1.005–1.030)
pH: 8 (ref 5.0–8.0)

## 2019-09-13 LAB — PREGNANCY, URINE: Preg Test, Ur: NEGATIVE

## 2019-09-13 MED ORDER — PROCHLORPERAZINE MALEATE 10 MG PO TABS: 10.0000 mg | ORAL_TABLET | Freq: Two times a day (BID) | ORAL | 0 refills | Status: DC | PRN

## 2019-09-13 MED ORDER — CAPSAICIN 0.025 % EX CREA
TOPICAL_CREAM | Freq: Once | CUTANEOUS | Status: AC
  Filled 2019-09-13: qty 60

## 2019-09-13 MED ORDER — HALOPERIDOL LACTATE 5 MG/ML IJ SOLN
5.0000 mg | Freq: Once | INTRAMUSCULAR | Status: AC
  Administered 2019-09-13: 5 mg via INTRAVENOUS
  Filled 2019-09-13: qty 1

## 2019-09-13 MED ORDER — SODIUM CHLORIDE 0.9 % IV BOLUS
1000.0000 mL | Freq: Once | INTRAVENOUS | Status: AC
  Administered 2019-09-13: 1000 mL via INTRAVENOUS

## 2019-09-13 MED ORDER — PROCHLORPERAZINE EDISYLATE 10 MG/2ML IJ SOLN
10.0000 mg | Freq: Once | INTRAMUSCULAR | Status: AC
  Administered 2019-09-13: 08:00:00 10 mg via INTRAVENOUS
  Filled 2019-09-13: qty 2

## 2019-09-13 MED ORDER — ONDANSETRON HCL 4 MG/2ML IJ SOLN
INTRAMUSCULAR | Status: AC
  Filled 2019-09-13: qty 2

## 2019-09-13 NOTE — ED Notes (Signed)
Pt requesting to see MD, provider at bedside

## 2019-09-13 NOTE — ED Notes (Signed)
Pt states feels better and ready to leave, removed own IV, provided paper scrub pants for discharge.  Dr Bernette Mayers made aware.

## 2019-09-13 NOTE — ED Notes (Signed)
Discharge paperwork completed by Dr Bernette Mayers, attempt to find pt in parking lot prior to leaving to discuss follow up and prescription, unable to locate.

## 2019-09-13 NOTE — ED Triage Notes (Signed)
Pt reports emesis x3 days.  Seen at [facility] for same.

## 2019-09-13 NOTE — ED Notes (Signed)
IV site removed from Left ac per pt request due to discomfort. Site clean and intact on removal.

## 2019-09-13 NOTE — ED Notes (Signed)
Pt continues to report pain at 10/10

## 2019-09-13 NOTE — ED Notes (Signed)
Pt reports improvement in vomiting and nausea. Provided ice water for po fluid challenge.

## 2019-09-13 NOTE — ED Notes (Signed)
Pt to bathroom

## 2019-09-13 NOTE — ED Notes (Signed)
Pt unable to provide urine specimen at this time.  Arrives vomiting small amounts

## 2019-09-13 NOTE — ED Provider Notes (Addendum)
[place] EMERGENCY DEPARTMENT Provider Note  CSN: [id number] Arrival date & time: 09/13/19 2725    History Chief Complaint  Patient presents with  . Emesis    HPI  Mary Peters is a 29 y.o. female presents for evaluation of continued nausea vomiting and abdominal pain for the last several days. She reports aching LUQ pain, associated with numerous episodes of vomiting with dry heaves and occasional streaks of blood. She was seen at [facility] for same 2 days ago. Labs then were unremarkable and symptoms reportedly improved after IVF, Zofran and Compazine, but their notes indicate the patient left before receiving her paperwork/prescriptions. Patient reports 'they didn't do anything for me'. She denies any other medical problems, does not take any medications. Denies EtOH or marijuana use, but reports using a 'CBD Pen' several times a week.    History reviewed. No pertinent past medical history.  History reviewed. No pertinent surgical history.  Family History  Problem Relation Age of Onset  . Diabetes Mother   . COPD Maternal Grandmother   . Stomach cancer Maternal Grandfather     Social History   Tobacco Use  . Smoking status: Never Smoker  . Smokeless tobacco: Never Used  Substance Use Topics  . Alcohol use: No  . Drug use: No     Home Medications Prior to Admission medications   Medication Sig Start Date End Date Taking? Authorizing Provider  prochlorperazine (COMPAZINE) 10 MG tablet Take 1 tablet (10 mg total) by mouth 2 (two) times daily as needed for nausea or vomiting. 09/13/19   Truddie Hidden, MD  promethazine (PHENERGAN) 12.5 MG tablet Take 2 tablets (25 mg total) by mouth every 8 (eight) hours as needed for nausea or vomiting. 09/06/17 09/13/19  Maczis, Barth Kirks, PA-C     Allergies    Patient has no known allergies.   Review of Systems   Review of Systems  Constitutional: Negative for fever.  HENT: Negative for congestion and sore throat.     Respiratory: Negative for cough and shortness of breath.   Cardiovascular: Negative for chest pain.  Gastrointestinal: Positive for abdominal pain, nausea and vomiting. Negative for diarrhea.  Genitourinary: Negative for dysuria.  Musculoskeletal: Negative for myalgias.  Skin: Negative for rash.  Neurological: Negative for headaches.  Psychiatric/Behavioral: Negative for behavioral problems.     Physical Exam Pulse 81   Temp 98.5 F (36.9 C) (Oral)   Resp 16   Ht 5' (1.524 m)   Wt 54.4 kg   SpO2 100%   BMI 23.44 kg/m   Physical Exam Vitals and nursing note reviewed.  Constitutional:      Appearance: Normal appearance.  HENT:     Head: Normocephalic and atraumatic.     Nose: Nose normal.     Mouth/Throat:     Mouth: Mucous membranes are moist.  Eyes:     Extraocular Movements: Extraocular movements intact.     Conjunctiva/sclera: Conjunctivae normal.  Cardiovascular:     Rate and Rhythm: Normal rate.  Pulmonary:     Effort: Pulmonary effort is normal.     Breath sounds: Normal breath sounds.  Abdominal:     General: Abdomen is flat. There is no distension.     Palpations: Abdomen is soft.     Tenderness: There is abdominal tenderness (mild diffuse). There is no guarding or rebound.  Musculoskeletal:        General: No swelling. Normal range of motion.     Cervical back: Neck  supple.  Skin:    General: Skin is warm and dry.  Neurological:     General: No focal deficit present.     Mental Status: She is alert.  Psychiatric:        Mood and Affect: Mood normal.      ED Results / Procedures / Treatments   Labs (all labs ordered are listed, but only abnormal results are displayed) Labs Reviewed  RAPID URINE DRUG SCREEN, HOSP PERFORMED - Abnormal; Notable for the following components:      Result Value   Tetrahydrocannabinol POSITIVE (*)    All other components within normal limits  URINALYSIS, ROUTINE W REFLEX MICROSCOPIC - Abnormal; Notable for the  following components:   APPearance HAZY (*)    Ketones, ur 15 (*)    All other components within normal limits  COMPREHENSIVE METABOLIC PANEL - Abnormal; Notable for the following components:   Potassium 3.1 (*)    Glucose, Bld 120 (*)    BUN 5 (*)    Calcium 8.3 (*)    All other components within normal limits  PREGNANCY, URINE  CBC WITH DIFFERENTIAL/PLATELET    EKG EKG Interpretation  Date/Time:  Saturday Sep 13 2019 07:41:44 EDT Ventricular Rate:  78 PR Interval:    QRS Duration: 102 QT Interval:  376 QTC Calculation: 429 R Axis:   68 Text Interpretation: Sinus rhythm Consider left atrial enlargement No old tracing to compare Confirmed by Susy Frizzle [phone]) on 09/13/2019 7:45:55 AM    Radiology No results found.  Procedures Procedures  Medications Ordered in the ED Medications  prochlorperazine (COMPAZINE) injection 10 mg (10 mg Intravenous Given 09/13/19 0810)  sodium chloride 0.9 % bolus 1,000 mL (0 mLs Intravenous Stopped 09/13/19 0917)  haloperidol lactate (HALDOL) injection 5 mg (5 mg Intravenous Given 09/13/19 0926)  capsaicin (ZOSTRIX) 0.025 % cream ( Topical Given 09/13/19 0932)     ED Course  I have reviewed the triage vital signs and the nursing notes.  Pertinent labs & imaging results that were available during my care of the patient were reviewed by me and considered in my medical decision making (see chart for details).  Clinical Course as of Sep 13 1107  Sat Sep 13, 2019  0820 Patient with continued nausea, vomiting and abdominal pain. Emesis bag contents visualized, several small streaks of blood mixed with stomach contents/foamy saliva. Will check labs to compare to recent ED visit. IVF and compazine ordered.    [CS]  0901 Patient demanding that her IV be moved to the other arm because it is sore. She is also demanding narcotic pain medications even though I do not yet see an indication for narcotic to be given.    [CS]  [id number] UDS positive for THC  which should not be the case if she is only using CBD products. Will give Haldol for suspected cannabinoid hyperemesis syndrome.    [CS]  6384 Pt informed of UDS findings but still does not admit to Marijuana use.    [CS]  1058 Patient asked to have her IV taken out. Refused to attempt PO trial and asking to be discharged. She was encouraged to stop smoking marijuana and/or using CBD pen. Followup with PCP. Rx for Compazine as needed for nausea.    [CS]  1109 Patient has left without receiving her paperwork/Rx   [CS]    Clinical Course User Index [CS] Pollyann Savoy, MD    MDM Rules/Calculators/A&P MDM  Final Clinical Impression(s) / ED Diagnoses Final  diagnoses:  Non-intractable vomiting with nausea, unspecified vomiting type    Rx / DC Orders ED Discharge Orders         Ordered    prochlorperazine (COMPAZINE) 10 MG tablet  2 times daily PRN     09/13/19 1102           Pollyann Savoy, MD 09/13/19 1102    Pollyann Savoy, MD 09/13/19 1109

## 2019-09-13 NOTE — ED Notes (Signed)
Pt walked out without awaiting discharge  paperwork from MD

## 2019-09-14 ENCOUNTER — Other Ambulatory Visit: Payer: Self-pay

## 2019-09-14 ENCOUNTER — Encounter (HOSPITAL_BASED_OUTPATIENT_CLINIC_OR_DEPARTMENT_OTHER): Payer: Self-pay

## 2019-09-14 ENCOUNTER — Emergency Department (HOSPITAL_BASED_OUTPATIENT_CLINIC_OR_DEPARTMENT_OTHER)
Admission: EM | Admit: 2019-09-14 | Discharge: 2019-09-14 | Disposition: A | Discharge status: Home / Self Care | Payer: Self-pay | Attending: Emergency Medicine | Admitting: Emergency Medicine

## 2019-09-14 DIAGNOSIS — G2402 Drug induced acute dystonia: Secondary | ICD-10-CM | POA: Insufficient documentation

## 2019-09-14 DIAGNOSIS — Z79899 Other long term (current) drug therapy: Secondary | ICD-10-CM | POA: Insufficient documentation

## 2019-09-14 MED ORDER — DIPHENHYDRAMINE HCL 25 MG PO TABS: 25.0000 mg | ORAL_TABLET | Freq: Four times a day (QID) | ORAL | 0 refills | Status: DC | PRN

## 2019-09-14 MED ORDER — BENZTROPINE MESYLATE 1 MG PO TABS: 1.0000 mg | ORAL_TABLET | Freq: Two times a day (BID) | ORAL | 0 refills | Status: DC

## 2019-09-14 MED ORDER — DIPHENHYDRAMINE HCL 25 MG PO CAPS
50.0000 mg | ORAL_CAPSULE | Freq: Once | ORAL | Status: AC
  Administered 2019-09-14: 50 mg via ORAL
  Filled 2019-09-14: qty 2

## 2019-09-14 MED ORDER — BENZTROPINE MESYLATE 1 MG/ML IJ SOLN
1.0000 mg | Freq: Once | INTRAMUSCULAR | Status: AC
  Administered 2019-09-14: 1 mg via INTRAMUSCULAR
  Filled 2019-09-14: qty 2

## 2019-09-14 NOTE — ED Triage Notes (Addendum)
Pt states she was seen here a few days ago for vomiting. Yesterday her tongue started to feel "weird." She thinks it is swollen or numb. Airway is intact with no acute distress.

## 2019-09-14 NOTE — ED Notes (Signed)
Pt discharged to home. Discharge instructions have been discussed with patient and/or family members. Pt verbally acknowledges understanding d/c instructions, and endorses comprehension to checkout at registration before leaving.  °

## 2019-09-14 NOTE — Discharge Instructions (Addendum)
Please follow-up with your primary care doctor.  I do not have one please call the [facility] for follow-up.  Please take Benadryl 50 mg every 6 hours.  Please use benztropine 1 mg twice daily.

## 2019-09-14 NOTE — ED Provider Notes (Signed)
[facility] EMERGENCY DEPARTMENT Provider Note   CSN: [id number] Arrival date & time: 09/14/19  1537     History Chief Complaint  Patient presents with  . Oral Swelling    Mary Peters is a 29 y.o. female.  HPI Patient is a 29 year old female with no pertinent past medical history presented today for abnormal tongue movements, jitteriness, loss of control of time.  Patient denies any associated symptoms.  She states that this is on going since yesterday afternoon.  She states that she was seen in the emergency department yesterday and given nausea medications.  She states she has had no recurrence of nausea or vomiting since she was discharged.  Patient is on no antipsychotic medications.  She denies any serotonin reuptake inhibitor use.  Although she is prescribed prochlorperazine she states she has never taken this medication is unaware of its existence.  Patient denies any focal weakness or sensation loss.  She denies any numbness of her tongue and describes it more as a loss of control.  She is able to swallow and breathe without difficulty.    History reviewed. No pertinent past medical history.  Patient Active Problem List   Diagnosis Date Noted  . 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 Cervical paraspinal muscle spasm 10/25/2016    History reviewed. No pertinent surgical history.   OB History   No obstetric history on file.     Family History  Problem Relation Age of Onset  . Diabetes Mother   . COPD Maternal Grandmother   . Stomach cancer Maternal Grandfather     Social History   Tobacco Use  . Smoking status: Never Smoker  . Smokeless tobacco: Never Used  Substance Use Topics  . Alcohol use: No  . Drug use: Yes    Types: Marijuana    Home Medications Prior to Admission medications   Medication Sig Start Date End Date Taking? Authorizing Provider  benztropine  (COGENTIN) 1 MG tablet Take 1 tablet (1 mg total) by mouth 2 (two) times daily for 14 days. 09/14/19 09/28/19  Gailen Shelter, PA  diphenhydrAMINE (BENADRYL) 25 MG tablet Take 1 tablet (25 mg total) by mouth every 6 (six) hours as needed. 09/14/19   Gailen Shelter, PA  prochlorperazine (COMPAZINE) 10 MG tablet Take 1 tablet (10 mg total) by mouth 2 (two) times daily as needed for nausea or vomiting. 09/13/19   Pollyann Savoy, MD  promethazine (PHENERGAN) 12.5 MG tablet Take 2 tablets (25 mg total) by mouth every 8 (eight) hours as needed for nausea or vomiting. 09/06/17 09/13/19  Maczis, Elmer Sow, PA-C    Allergies    Patient has no known allergies.  Review of Systems   Review of Systems  Constitutional: Negative for fever.  HENT: Negative for congestion.        Abnormal tongue movement  Respiratory: Negative for shortness of breath.   Cardiovascular: Negative for chest pain.  Gastrointestinal: Negative for abdominal distention.  Neurological: Negative for dizziness, syncope, speech difficulty, light-headedness, numbness and headaches.    Physical Exam Updated Vital Signs BP (!) 124/97   Pulse 77   Temp 98.8 F (37.1 C) (Oral)   Resp 16   Ht 5' (1.524 m)   Wt 54.4 kg   LMP 09/03/2019   SpO2 98%   BMI 23.44 kg/m   Physical Exam Vitals and nursing note reviewed.  Constitutional:      General: She  is not in acute distress.    Appearance: Normal appearance. She is not ill-appearing.  HENT:     Head: Normocephalic and atraumatic.     Mouth/Throat:     Mouth: Mucous membranes are moist.     Comments: Patient is able to move her tongue in and out does seem to have voluntary control of it however at rest she was sticking her tongue of her mouth and has tremulous movements of the tongue.  There is no swelling or tenderness or erythema of the tongue.  There are no lesions.  There is no oral lesions also bilateral.  No trismus. Eyes:     General: No scleral icterus.       Right eye:  No discharge.        Left eye: No discharge.     Conjunctiva/sclera: Conjunctivae normal.  Cardiovascular:     Rate and Rhythm: Normal rate.  Pulmonary:     Effort: Pulmonary effort is normal.     Breath sounds: No stridor.  Neurological:     Mental Status: She is alert and oriented to person, place, and time. Mental status is at baseline.     Comments: Alert and oriented to self, place, time and event.   Speech is fluent, clear without dysarthria or dysphasia.   Strength 5/5 in upper/lower extremities  Sensation intact in upper/lower extremities   Normal gait.  Negative Romberg. No pronator drift.  Normal finger-to-nose and feet tapping.  CN I not tested  CN II grossly intact visual fields bilaterally. Did not visualize posterior eye.   CN III, IV, VI PERRLA and EOMs intact bilaterally  CN V Intact sensation to sharp and light touch to the face  CN VII facial movements symmetric  CN VIII not tested  CN IX, X no uvula deviation, symmetric rise of soft palate  CN XI 5/5 SCM and trapezius strength bilaterally  CN XII Midline tongue protrusion, symmetric L/R movements      ED Results / Procedures / Treatments   Labs (all labs ordered are listed, but only abnormal results are displayed) Labs Reviewed - No data to display  EKG None  Radiology No results found.  Procedures Procedures (including critical care time)  Medications Ordered in ED Medications  diphenhydrAMINE (BENADRYL) capsule 50 mg (50 mg Oral Given 09/14/19 1621)  benztropine mesylate (COGENTIN) injection 1 mg (1 mg Intramuscular Given 09/14/19 1621)    ED Course  I have reviewed the triage vital signs and the nursing notes.  Pertinent labs & imaging results that were available during my care of the patient were reviewed by me and considered in my medical decision making (see chart for details).    MDM Rules/Calculators/A&P                      Patient is very concerned acute dystonic reaction after  receiving Haldol medication yesterday.  She is tolerating fluids without difficulty, is able to swallow and her airway is uncompromised.  She is well-appearing on physical exam has vital signs within normal limits.  I provided patient with Benadryl and Cogentin.  She was mildly improved on reassessment.  We will discharge her with these medications to use at home.  She will follow up with primary care doctor.  Differential diagnosis considered neuroleptic malignant syndrome, serotonin syndrome, Bell's palsy.  Is a very unlikely given her symptoms and the history provided.  She is neurologically intact doubt any type of stroke or central cause  of these symptoms   She will return to ED for any new or concerning symptoms.   Final Clinical Impression(s) / ED Diagnoses Final diagnoses:  Dystonic drug reaction   I discussed this case with my attending physician who cosigned this note including patient's presenting symptoms, physical exam, and planned diagnostics and interventions. Attending physician stated agreement with plan or made changes to plan which were implemented.   Attending physician assessed patient at bedside.  Rx / DC Orders ED Discharge Orders         Ordered    benztropine (COGENTIN) 1 MG tablet  2 times daily     09/14/19 1635    diphenhydrAMINE (BENADRYL) 25 MG tablet  Every 6 hours PRN     09/14/19 1635           Gailen Shelter, [place] 09/14/19 1712    Arby Barrette, MD 09/14/19 2002

## 2019-12-26 ENCOUNTER — Other Ambulatory Visit: Payer: Self-pay

## 2019-12-26 ENCOUNTER — Encounter (HOSPITAL_BASED_OUTPATIENT_CLINIC_OR_DEPARTMENT_OTHER): Payer: Self-pay | Admitting: *Deleted

## 2019-12-26 DIAGNOSIS — W458XXA Other foreign body or object entering through skin, initial encounter: Secondary | ICD-10-CM | POA: Insufficient documentation

## 2019-12-26 DIAGNOSIS — Y929 Unspecified place or not applicable: Secondary | ICD-10-CM | POA: Insufficient documentation

## 2019-12-26 DIAGNOSIS — Y939 Activity, unspecified: Secondary | ICD-10-CM | POA: Insufficient documentation

## 2019-12-26 DIAGNOSIS — S00462A Insect bite (nonvenomous) of left ear, initial encounter: Secondary | ICD-10-CM | POA: Insufficient documentation

## 2019-12-26 DIAGNOSIS — Z5321 Procedure and treatment not carried out due to patient leaving prior to being seen by health care provider: Secondary | ICD-10-CM | POA: Insufficient documentation

## 2019-12-26 DIAGNOSIS — Y999 Unspecified external cause status: Secondary | ICD-10-CM | POA: Insufficient documentation

## 2019-12-26 NOTE — ED Triage Notes (Signed)
She thinks she has a bug in her left ear for the past 2 days.

## 2019-12-27 ENCOUNTER — Emergency Department (HOSPITAL_BASED_OUTPATIENT_CLINIC_OR_DEPARTMENT_OTHER)
Admission: EM | Admit: 2019-12-27 | Discharge: 2019-12-27 | Disposition: A | Discharge status: Home / Self Care | Payer: Self-pay | Attending: Emergency Medicine | Admitting: Emergency Medicine

## 2020-05-07 ENCOUNTER — Encounter (HOSPITAL_BASED_OUTPATIENT_CLINIC_OR_DEPARTMENT_OTHER): Payer: Self-pay | Admitting: *Deleted

## 2020-05-07 ENCOUNTER — Emergency Department (HOSPITAL_BASED_OUTPATIENT_CLINIC_OR_DEPARTMENT_OTHER)
Admission: EM | Admit: 2020-05-07 | Discharge: 2020-05-07 | Disposition: A | Discharge status: Home / Self Care | Payer: Self-pay | Attending: Emergency Medicine | Admitting: Emergency Medicine

## 2020-05-07 ENCOUNTER — Other Ambulatory Visit: Payer: Self-pay

## 2020-05-07 ENCOUNTER — Emergency Department (HOSPITAL_BASED_OUTPATIENT_CLINIC_OR_DEPARTMENT_OTHER): Payer: Self-pay

## 2020-05-07 DIAGNOSIS — R112 Nausea with vomiting, unspecified: Secondary | ICD-10-CM | POA: Insufficient documentation

## 2020-05-07 DIAGNOSIS — R1084 Generalized abdominal pain: Secondary | ICD-10-CM | POA: Insufficient documentation

## 2020-05-07 LAB — URINALYSIS, MICROSCOPIC (REFLEX): RBC / HPF: >50 RBC/hpf (ref 0–5)

## 2020-05-07 LAB — COMPREHENSIVE METABOLIC PANEL
ALT: 17 U/L (ref 0–44)
AST: 20 U/L (ref 15–41)
Albumin: 4.5 g/dL (ref 3.5–5.0)
Alkaline Phosphatase: 46 U/L (ref 38–126)
Anion gap: 9 (ref 5–15)
BUN: 7 mg/dL (ref 6–20)
CO2: 23 mmol/L (ref 22–32)
Calcium: 9.4 mg/dL (ref 8.9–10.3)
Chloride: 110 mmol/L (ref 98–111)
Creatinine, Ser: 0.73 mg/dL (ref 0.44–1.00)
GFR, Estimated: >60 mL/min (ref >60)
Glucose, Bld: 119 mg/dL — ABNORMAL HIGH (ref 70–99)
Potassium: 3.6 mmol/L (ref 3.5–5.1)
Sodium: 142 mmol/L (ref 135–145)
Total Bilirubin: 0.5 mg/dL (ref 0.3–1.2)
Total Protein: 7.8 g/dL (ref 6.5–8.1)

## 2020-05-07 LAB — CBC
HCT: 41.4 % (ref 36.0–46.0)
Hemoglobin: 14 g/dL (ref 12.0–15.0)
MCH: 30.4 pg (ref 26.0–34.0)
MCHC: 33.8 g/dL (ref 30.0–36.0)
MCV: 90 fL (ref 80.0–100.0)
Platelets: 313 10*3/uL (ref 150–400)
RBC: 4.6 MIL/uL (ref 3.87–5.11)
RDW: 12.6 % (ref 11.5–15.5)
WBC: 11.6 10*3/uL — ABNORMAL HIGH (ref 4.0–10.5)
nRBC: 0 % (ref 0.0–0.2)

## 2020-05-07 LAB — URINALYSIS, ROUTINE W REFLEX MICROSCOPIC
Bilirubin Urine: NEGATIVE
Glucose, UA: NEGATIVE mg/dL
Ketones, ur: 80 mg/dL — AB
Leukocytes,Ua: NEGATIVE
Nitrite: NEGATIVE
Protein, ur: NEGATIVE mg/dL
Specific Gravity, Urine: 1.025 (ref 1.005–1.030)
pH: 6.5 (ref 5.0–8.0)

## 2020-05-07 LAB — PREGNANCY, URINE: Preg Test, Ur: NEGATIVE

## 2020-05-07 LAB — LIPASE, BLOOD: Lipase: 27 U/L (ref 11–51)

## 2020-05-07 MED ORDER — KETOROLAC TROMETHAMINE 15 MG/ML IJ SOLN: 15.0000 mg | Freq: Once | INTRAMUSCULAR | Status: DC

## 2020-05-07 MED ORDER — LIDOCAINE VISCOUS HCL 2 % MT SOLN
15.0000 mL | Freq: Once | OROMUCOSAL | Status: AC
  Administered 2020-05-07: 15 mL via ORAL
  Filled 2020-05-07: qty 15

## 2020-05-07 MED ORDER — ONDANSETRON HCL 4 MG/2ML IJ SOLN
4.0000 mg | Freq: Once | INTRAMUSCULAR | Status: AC
  Administered 2020-05-07: 4 mg via INTRAVENOUS
  Filled 2020-05-07: qty 2

## 2020-05-07 MED ORDER — DROPERIDOL 2.5 MG/ML IJ SOLN
2.5000 mg | Freq: Once | INTRAMUSCULAR | Status: AC
  Administered 2020-05-07: 2.5 mg via INTRAVENOUS

## 2020-05-07 MED ORDER — DROPERIDOL 2.5 MG/ML IJ SOLN
INTRAMUSCULAR | Status: AC
  Filled 2020-05-07: qty 2

## 2020-05-07 MED ORDER — ALUM & MAG HYDROXIDE-SIMETH 200-200-20 MG/5ML PO SUSP
30.0000 mL | Freq: Once | ORAL | Status: AC
  Administered 2020-05-07: 30 mL via ORAL
  Filled 2020-05-07: qty 30

## 2020-05-07 MED ORDER — SODIUM CHLORIDE 0.9 % IV BOLUS
1000.0000 mL | Freq: Once | INTRAVENOUS | Status: AC
  Administered 2020-05-07: 1000 mL via INTRAVENOUS

## 2020-05-07 MED ORDER — KETOROLAC TROMETHAMINE 30 MG/ML IJ SOLN
30.0000 mg | Freq: Once | INTRAMUSCULAR | Status: AC
  Administered 2020-05-07: 30 mg via INTRAMUSCULAR
  Filled 2020-05-07: qty 1

## 2020-05-07 NOTE — ED Provider Notes (Signed)
[facility] EMERGENCY DEPARTMENT Provider Note   CSN: [id number] Arrival date & time: 05/07/20  1004     History Chief Complaint  Patient presents with  . Abdominal Pain    Mary Peters is a 29 y.o. female with no pertinent medical history or previous abdominal surgeries.  Presents with chief complaint of generalized abdominal pain, nausea and vomiting.  Reports that her abdominal pain began gradually at 0200 this morning, she reports the pain is constant, 8/10 on the pain scale, describes pain as a "burning sensation", no alleviating or aggravating symptoms.  Patient is unable to quantify the amount of time she has vomited.  Patient reports seeing streaks of blood in her emesis but no coffee-ground emesis.   Patient reports currently on her menstrual cycle which started yesterday, reports menstrual cycle is heavier than usual.  Gravida 1, para 1001.  Patient endorses chills and increased urination.  Reports she is not sexually active.  Patient denies any alcohol or tobacco use.  Patient reports occasional marijuana use.    Patient denies any fevers, chest pain, shortness of breath, cough, melena, blood in stool, diarrhea, dysuria.    Patient reports that she has had multiple episodes of abdominal pain, nausea, and vomiting in the past.  Patient reports in the past that these episodes have not been around her menstrual cycle.    HPI     History reviewed. No pertinent past medical history.  Patient Active Problem List   Diagnosis Date Noted  . 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 Cervical paraspinal muscle spasm 10/25/2016    History reviewed. No pertinent surgical history.   OB History   No obstetric history on file.     Family History  Problem Relation Age of Onset  . Diabetes Mother   . COPD Maternal Grandmother   . Stomach cancer Maternal Grandfather     Social History   Tobacco  Use  . Smoking status: Never Smoker  . Smokeless tobacco: Never Used  Vaping Use  . Vaping Use: Never used  Substance Use Topics  . Alcohol use: No  . Drug use: Yes    Types: Marijuana    Home Medications Prior to Admission medications   Medication Sig Start Date End Date Taking? Authorizing Provider  benztropine (COGENTIN) 1 MG tablet Take 1 tablet (1 mg total) by mouth 2 (two) times daily for 14 days. 09/14/19 09/28/19  Tedd Sias, PA  diphenhydrAMINE (BENADRYL) 25 MG tablet Take 1 tablet (25 mg total) by mouth every 6 (six) hours as needed. 09/14/19   Tedd Sias, PA  prochlorperazine (COMPAZINE) 10 MG tablet Take 1 tablet (10 mg total) by mouth 2 (two) times daily as needed for nausea or vomiting. 09/13/19   Truddie Hidden, MD  promethazine (PHENERGAN) 12.5 MG tablet Take 2 tablets (25 mg total) by mouth every 8 (eight) hours as needed for nausea or vomiting. 09/06/17 09/13/19  Maczis, Barth Kirks, PA-C    Allergies    Patient has no known allergies.  Review of Systems   Review of Systems  Constitutional: Positive for chills. Negative for fever.  Eyes: Negative for visual disturbance.  Respiratory: Negative for cough and shortness of breath.   Cardiovascular: Negative for chest pain.  Gastrointestinal: Positive for abdominal pain (Generalized), nausea and vomiting. Negative for blood in stool, constipation and diarrhea.  Genitourinary: Positive for frequency and vaginal bleeding (on menstrual cycle). Negative for difficulty  urinating, dysuria and flank pain.  Musculoskeletal: Positive for back pain (Generalized). Negative for myalgias and neck pain.  Skin: Negative for color change and rash.  Neurological: Negative for dizziness, syncope, light-headedness and headaches.  Psychiatric/Behavioral: Negative for confusion.    Physical Exam Updated Vital Signs BP 116/76   Pulse 94   Temp 98.7 F (37.1 C) (Oral)   Resp 16   Ht 5' (1.524 m)   Wt 54.4 kg   LMP 05/03/2020    SpO2 100%   BMI 23.44 kg/m   Physical Exam Vitals and nursing note reviewed.  Constitutional:      General: She is not in acute distress.    Appearance: She is not ill-appearing, toxic-appearing or diaphoretic.  HENT:     Head: Normocephalic.  Eyes:     General: No scleral icterus.       Right eye: No discharge.        Left eye: No discharge.  Cardiovascular:     Rate and Rhythm: Normal rate.     Heart sounds: Normal heart sounds.  Pulmonary:     Effort: Pulmonary effort is normal. No respiratory distress.     Breath sounds: Normal breath sounds. No wheezing, rhonchi or rales.  Abdominal:     General: Abdomen is flat. Bowel sounds are normal. There is no distension.     Palpations: Abdomen is soft. There is no mass or pulsatile mass.     Tenderness: There is generalized abdominal tenderness. There is no right CVA tenderness, left CVA tenderness, guarding or rebound. Negative signs include Murphy's sign, Rovsing's sign and McBurney's sign.  Musculoskeletal:     Cervical back: Neck supple.     Right lower leg: No edema.     Left lower leg: No edema.  Skin:    General: Skin is warm and dry.  Neurological:     General: No focal deficit present.     Mental Status: She is alert.  Psychiatric:        Behavior: Behavior is cooperative.     ED Results / Procedures / Treatments   Labs (all labs ordered are listed, but only abnormal results are displayed) Labs Reviewed  COMPREHENSIVE METABOLIC PANEL - Abnormal; Notable for the following components:      Result Value   Glucose, Bld 119 (*)    All other components within normal limits  CBC - Abnormal; Notable for the following components:   WBC 11.6 (*)    All other components within normal limits  URINALYSIS, ROUTINE W REFLEX MICROSCOPIC - Abnormal; Notable for the following components:   APPearance CLOUDY (*)    Hgb urine dipstick LARGE (*)    Ketones, ur 80 (*)    All other components within normal limits  URINALYSIS,  MICROSCOPIC (REFLEX) - Abnormal; Notable for the following components:   Bacteria, UA MANY (*)    All other components within normal limits  URINE CULTURE  LIPASE, BLOOD  PREGNANCY, URINE    EKG None  Radiology DG Chest Portable 1 View  Result Date: 05/07/2020 CLINICAL DATA:  Abdominal pain, vomiting EXAM: PORTABLE CHEST 1 VIEW COMPARISON:  09/06/2017 FINDINGS: The heart size and mediastinal contours are within normal limits. Both lungs are clear. The visualized skeletal structures are unremarkable. IMPRESSION: Negative Electronically Signed   By: Rolm Baptise M.D.   On: 05/07/2020 14:19    Procedures Procedures (including critical care time)  Medications Ordered in ED Medications  droperidol (INAPSINE) 2.5 MG/ML injection (  Not  Given 05/07/20 1801)  sodium chloride 0.9 % bolus 1,000 mL (0 mLs Intravenous Stopped 05/07/20 1855)  ondansetron (ZOFRAN) injection 4 mg (4 mg Intravenous Given 05/07/20 1700)  droperidol (INAPSINE) 2.5 MG/ML injection 2.5 mg (2.5 mg Intravenous Given 05/07/20 1755)  alum & mag hydroxide-simeth (MAALOX/MYLANTA) 200-200-20 MG/5ML suspension 30 mL (30 mLs Oral Given 05/07/20 1929)    And  lidocaine (XYLOCAINE) 2 % viscous mouth solution 15 mL (15 mLs Oral Given 05/07/20 1929)  ketorolac (TORADOL) 30 MG/ML injection 30 mg (30 mg Intramuscular Given 05/07/20 1929)    ED Course  I have reviewed the triage vital signs and the nursing notes.  Pertinent labs & imaging results that were available during my care of the patient were reviewed by me and considered in my medical decision making (see chart for details).    MDM Rules/Calculators/A&P                          Alert 29 year old female in no acute distress, nontoxic-appearing.  Patient presents with complaint of generalized abdominal pain nausea and vomiting.  Per chart review patient has been seen multiple times for this complaint.    Patient's abdomen is soft, nondistended, normoactive bowel  sounds, tenderness is generalized.  Abdomen is non surgical.   Lipase is 27 less concerning for pancreatitis.  Urine pregnancy is negative, no concern for ectopic pregnancy.  Patient has no history of abdominal surgeries was concerning for small bowel obstruction.  AST, ALT, alk phos, total bilirubin all within normal limits less concerning for hepatobiliary disease.  Urinalysis shows the BC 6-10, bacteria many, Hgb large, squamous epithelial 6-10; patient is on her menstrual cycle which likely caused abnormality seen on UA.  Patient is not complaining of dysuria or flank pain; less concern for UTI or pyelonephritis.  However urine culture placed and pending.    Patient given 1 L fluid bolus and Zofran.  Will reassess patient.    17:22 patient continues to complain of nausea and vomiting.  On reassessment patient abdomen remains soft, nondistended, no focal tenderness to abdomen.  Will order an EKG to evaluate QTC, if no prolongation will give patient droperidol.  With patient's persistent nausea and vomiting, concern for cannabinoid hyperemesis.    EKG shows sinus arrhythmia, rate of 76, QTC found to be 431.  Patient was given droperidol  19:00 patient continues to complain of generalized abdominal pain, abdomen remains soft nondistended no focal tenderness.  She received droperidol at 1755, reports she has not vomited since then.    17:35 patient reports feeling better and is asking to be discharged.  Discussed results, findings, treatment and follow up. Patient advised of return precautions. Patient verbalized understanding and agreed with plan.    Final Clinical Impression(s) / ED Diagnoses Final diagnoses:  Non-intractable vomiting with nausea, unspecified vomiting type  Generalized abdominal pain    Rx / DC Orders ED Discharge Orders    None       Dyann Ruddle 05/07/20 1946    Luna Fuse, MD 05/08/20 [phone]

## 2020-05-07 NOTE — Discharge Instructions (Addendum)
You came to the emergency department to be evaluated for your nausea and vomiting.  Your lab work and physical exam were reassuring.  We were unable to find the exact cause of your symptoms today.    It is possible that your symptoms are due to cannabinoid hyperemesis emesis syndrome; please refrain from smoking marijuana in the future to stop it from happening again.    Drink clear liquids until your stomach feels better. Then, slowly introduce bland foods into your diet as tolerated, such as bread, rice, apples, bananas.  Follow up with your primary care if symptoms persist.  Return to the ER for severe abdominal pain, fever, uncontrollable vomiting, or new or concerning symptoms.

## 2020-05-07 NOTE — ED Triage Notes (Signed)
Pt reports generalized abd pain and vomiting onset last night. Denies any fevers, cough, sob or sore throat.

## 2020-05-07 NOTE — ED Notes (Signed)
Patient left the room prior to DC instructions were given and signature was obtained.

## 2020-05-08 LAB — URINE CULTURE: Culture: NO GROWTH

## 2020-05-12 ENCOUNTER — Other Ambulatory Visit: Payer: Self-pay

## 2020-05-12 ENCOUNTER — Emergency Department (HOSPITAL_BASED_OUTPATIENT_CLINIC_OR_DEPARTMENT_OTHER)
Admission: EM | Admit: 2020-05-12 | Discharge: 2020-05-12 | Disposition: A | Discharge status: Home / Self Care | Payer: HRSA Program | Attending: Emergency Medicine | Admitting: Emergency Medicine

## 2020-05-12 ENCOUNTER — Other Ambulatory Visit (HOSPITAL_BASED_OUTPATIENT_CLINIC_OR_DEPARTMENT_OTHER): Payer: Self-pay | Admitting: Student

## 2020-05-12 ENCOUNTER — Encounter (HOSPITAL_BASED_OUTPATIENT_CLINIC_OR_DEPARTMENT_OTHER): Payer: Self-pay

## 2020-05-12 DIAGNOSIS — U071 COVID-19: Secondary | ICD-10-CM | POA: Insufficient documentation

## 2020-05-12 DIAGNOSIS — R112 Nausea with vomiting, unspecified: Secondary | ICD-10-CM

## 2020-05-12 LAB — CBC
HCT: 40.2 % (ref 36.0–46.0)
Hemoglobin: 13.9 g/dL (ref 12.0–15.0)
MCH: 29.9 pg (ref 26.0–34.0)
MCHC: 34.6 g/dL (ref 30.0–36.0)
MCV: 86.5 fL (ref 80.0–100.0)
Platelets: 315 10*3/uL (ref 150–400)
RBC: 4.65 MIL/uL (ref 3.87–5.11)
RDW: 12.1 % (ref 11.5–15.5)
WBC: 5 10*3/uL (ref 4.0–10.5)
nRBC: 0 % (ref 0.0–0.2)

## 2020-05-12 LAB — COMPREHENSIVE METABOLIC PANEL
ALT: 18 U/L (ref 0–44)
AST: 23 U/L (ref 15–41)
Albumin: 4.6 g/dL (ref 3.5–5.0)
Alkaline Phosphatase: 44 U/L (ref 38–126)
Anion gap: 11 (ref 5–15)
BUN: 5 mg/dL — ABNORMAL LOW (ref 6–20)
CO2: 20 mmol/L — ABNORMAL LOW (ref 22–32)
Calcium: 9.4 mg/dL (ref 8.9–10.3)
Chloride: 109 mmol/L (ref 98–111)
Creatinine, Ser: 0.67 mg/dL (ref 0.44–1.00)
GFR, Estimated: >60 mL/min (ref >60)
Glucose, Bld: 131 mg/dL — ABNORMAL HIGH (ref 70–99)
Potassium: 3.2 mmol/L — ABNORMAL LOW (ref 3.5–5.1)
Sodium: 140 mmol/L (ref 135–145)
Total Bilirubin: 0.2 mg/dL — ABNORMAL LOW (ref 0.3–1.2)
Total Protein: 7.7 g/dL (ref 6.5–8.1)

## 2020-05-12 LAB — URINALYSIS, ROUTINE W REFLEX MICROSCOPIC
Bilirubin Urine: NEGATIVE
Glucose, UA: NEGATIVE mg/dL
Hgb urine dipstick: NEGATIVE
Ketones, ur: 80 mg/dL — AB
Leukocytes,Ua: NEGATIVE
Nitrite: NEGATIVE
Protein, ur: NEGATIVE mg/dL
Specific Gravity, Urine: 1.015 (ref 1.005–1.030)
pH: 8 (ref 5.0–8.0)

## 2020-05-12 LAB — LIPASE, BLOOD: Lipase: 27 U/L (ref 11–51)

## 2020-05-12 LAB — HCG, SERUM, QUALITATIVE: Preg, Serum: NEGATIVE

## 2020-05-12 MED ORDER — POTASSIUM CHLORIDE 10 MEQ/100ML IV SOLN
10.0000 meq | Freq: Once | INTRAVENOUS | Status: AC
  Administered 2020-05-12: 10 meq via INTRAVENOUS
  Filled 2020-05-12: qty 100

## 2020-05-12 MED ORDER — ONDANSETRON 4 MG PO TBDP: 4.0000 mg | ORAL_TABLET | Freq: Three times a day (TID) | ORAL | 0 refills | Status: DC | PRN

## 2020-05-12 MED ORDER — LORAZEPAM 2 MG/ML IJ SOLN
1.0000 mg | Freq: Once | INTRAMUSCULAR | Status: AC
  Administered 2020-05-12: 1 mg via INTRAVENOUS
  Filled 2020-05-12: qty 1

## 2020-05-12 MED ORDER — ONDANSETRON HCL 4 MG/2ML IJ SOLN
4.0000 mg | Freq: Once | INTRAMUSCULAR | Status: AC
  Administered 2020-05-12: 4 mg via INTRAVENOUS
  Filled 2020-05-12: qty 2

## 2020-05-12 MED ORDER — SODIUM CHLORIDE 0.9 % IV SOLN: INTRAVENOUS | Status: DC

## 2020-05-12 MED ORDER — PROMETHAZINE HCL 25 MG/ML IJ SOLN
25.0000 mg | Freq: Once | INTRAMUSCULAR | Status: AC
  Administered 2020-05-12: 25 mg via INTRAVENOUS
  Filled 2020-05-12: qty 1

## 2020-05-12 MED ORDER — SODIUM CHLORIDE 0.9 % IV BOLUS
1000.0000 mL | Freq: Once | INTRAVENOUS | Status: AC
  Administered 2020-05-12: 1000 mL via INTRAVENOUS

## 2020-05-12 MED ORDER — ACETAMINOPHEN 325 MG PO TABS
650.0000 mg | ORAL_TABLET | Freq: Once | ORAL | Status: DC
  Filled 2020-05-12: qty 2

## 2020-05-12 NOTE — Discharge Instructions (Addendum)
Your work-up today was reassuring, your symptoms are from Covid as we discussed.  You will have symptoms for the next couple of days until your Covid resolves.  Try to stay hydrated and drink plenty of water.  I did prescribe you Zofran, please pick this up tomorrow and take this before you start to eat anything or drink anything.  If you have any new or worsening concerning symptoms please come back to the emergency department.  Please follow-up with your PCP in the next couple of days, if you do not have one you can go to [facility] health community health wellness.  Please isolate as instructed for 10 days.

## 2020-05-12 NOTE — ED Notes (Addendum)
PT aware of need for UA, PT stated that Zofran was not working. RN aware

## 2020-05-12 NOTE — ED Notes (Signed)
Pt provided with water for PO challenge. Pt took sip of water and tolerated well. No evidence of nausea or vomiting. Provider made aware.

## 2020-05-12 NOTE — ED Triage Notes (Addendum)
Pt c/o epigastric pain, n/v started this am-NAD-steady gait-states she was seen for the same recently

## 2020-05-12 NOTE — ED Provider Notes (Signed)
[facility] EMERGENCY DEPARTMENT Provider Note   CSN: [id number] Arrival date & time: 05/12/20  1219     History Chief Complaint  Patient presents with  . Abdominal Pain    Mary Peters is a 30 y.o. female with pertinent past medical history of UTI that presents the emergency department today for nausea, vomiting, epigastric pain. Per chart review patient was seen for the similar presentation 4 days ago at [facility] health, at that time she had nausea vomiting epigastric pain was found to be Covid positive.  At that time she was discharged, was tolerating p.o.  Patient then presented the next day to the emergency department for similar symptoms, at that time had negative chest x-ray, was able to be hydrated and discharged after she passed p.o. challenge again.  Patient was not vaccinated against Covid.  Today she states that she continues to vomit, states that she did not know that she was diagnosed with Covid a couple days ago.  States that they did not give her any medications for her nausea or vomiting.  Has not been able to tolerate any liquids per patient.  Denies any diarrhea, cough or URI symptoms.  Denies any fevers or chills.  States that she has generalized abdominal tenderness, more specific to her epigastric region.  States that it only hurts when she vomits.  Is constantly vomiting up clear liquid.  No hematemesis.  Denies any shortness of breath.  Is having myalgias in her chest, patient did have troponins at [facility] when she had similar presentation, these were negative.  Chest pain does appear musculoskeletal in nature.  No fevers or chills.  Patient is making urine.  Denies any dysuria.  HPI     History reviewed. No pertinent past medical history.  Patient Active Problem List   Diagnosis Date Noted  . 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 Cervical paraspinal muscle spasm 10/25/2016     History reviewed. No pertinent surgical history.   OB History   No obstetric history on file.     Family History  Problem Relation Age of Onset  . Diabetes Mother   . COPD Maternal Grandmother   . Stomach cancer Maternal Grandfather     Social History   Tobacco Use  . Smoking status: Never Smoker  . Smokeless tobacco: Never Used  Vaping Use  . Vaping Use: Never used  Substance Use Topics  . Alcohol use: No  . Drug use: Yes    Types: Marijuana    Home Medications Prior to Admission medications   Medication Sig Start Date End Date Taking? Authorizing Provider  ondansetron (ZOFRAN ODT) 4 MG disintegrating tablet Take 1 tablet (4 mg total) by mouth every 8 (eight) hours as needed for nausea or vomiting. 05/12/20  Yes Temari Schooler, Donne Anon, PA-C  benztropine (COGENTIN) 1 MG tablet Take 1 tablet (1 mg total) by mouth 2 (two) times daily for 14 days. 09/14/19 09/28/19  Gailen Shelter, PA  diphenhydrAMINE (BENADRYL) 25 MG tablet Take 1 tablet (25 mg total) by mouth every 6 (six) hours as needed. 09/14/19   Gailen Shelter, PA  prochlorperazine (COMPAZINE) 10 MG tablet Take 1 tablet (10 mg total) by mouth 2 (two) times daily as needed for nausea or vomiting. 09/13/19   Pollyann Savoy, MD  promethazine (PHENERGAN) 12.5 MG tablet Take 2 tablets (25 mg total) by mouth every 8 (eight) hours as needed for nausea or vomiting.  09/06/17 09/13/19  Maczis, Elmer Sow, PA-C    Allergies    Haldol [haloperidol]  Review of Systems   Review of Systems  Constitutional: Negative for chills, diaphoresis, fatigue and fever.  HENT: Negative for congestion, sore throat and trouble swallowing.   Eyes: Negative for pain and visual disturbance.  Respiratory: Negative for cough, shortness of breath and wheezing.   Cardiovascular: Negative for chest pain, palpitations and leg swelling.  Gastrointestinal: Positive for abdominal pain, nausea and vomiting. Negative for abdominal distention and diarrhea.   Genitourinary: Negative for difficulty urinating.  Musculoskeletal: Positive for arthralgias. Negative for back pain, neck pain and neck stiffness.  Skin: Negative for pallor.  Neurological: Negative for dizziness, speech difficulty, weakness and headaches.  Psychiatric/Behavioral: Negative for confusion.    Physical Exam Updated Vital Signs BP 105/75 (BP Location: Right Arm)   Pulse 79   Temp 98.6 F (37 C) (Oral)   Resp 18   LMP 05/03/2020   SpO2 100%   Physical Exam Constitutional:      General: She is not in acute distress.    Appearance: Normal appearance. She is not ill-appearing, toxic-appearing or diaphoretic.     Comments: Patient is actively vomiting up clear saliva, about 200 cc an emesis bag.  HENT:     Mouth/Throat:     Mouth: Mucous membranes are moist.     Pharynx: Oropharynx is clear.  Eyes:     General: No scleral icterus.    Extraocular Movements: Extraocular movements intact.     Pupils: Pupils are equal, round, and reactive to light.  Cardiovascular:     Rate and Rhythm: Normal rate and regular rhythm.     Pulses: Normal pulses.     Heart sounds: Normal heart sounds.  Pulmonary:     Effort: Pulmonary effort is normal. No respiratory distress.     Breath sounds: Normal breath sounds. No stridor. No wheezing, rhonchi or rales.  Chest:     Chest wall: Tenderness present.  Abdominal:     General: Abdomen is flat. Bowel sounds are normal. There is no distension.     Palpations: Abdomen is soft.     Tenderness: There is abdominal tenderness in the epigastric area. There is no guarding or rebound.  Musculoskeletal:        General: No swelling or tenderness. Normal range of motion.     Cervical back: Normal range of motion and neck supple. No rigidity.     Right lower leg: No edema.     Left lower leg: No edema.  Skin:    General: Skin is warm and dry.     Capillary Refill: Capillary refill takes less than 2 seconds.     Coloration: Skin is not pale.   Neurological:     General: No focal deficit present.     Mental Status: She is alert and oriented to person, place, and time.  Psychiatric:        Mood and Affect: Mood normal.        Behavior: Behavior normal.     ED Results / Procedures / Treatments   Labs (all labs ordered are listed, but only abnormal results are displayed) Labs Reviewed  COMPREHENSIVE METABOLIC PANEL - Abnormal; Notable for the following components:      Result Value   Potassium 3.2 (*)    CO2 20 (*)    Glucose, Bld 131 (*)    BUN 5 (*)    Total Bilirubin 0.2 (*)  All other components within normal limits  URINALYSIS, ROUTINE W REFLEX MICROSCOPIC - Abnormal; Notable for the following components:   Ketones, ur 80 (*)    All other components within normal limits  LIPASE, BLOOD  CBC  HCG, SERUM, QUALITATIVE    EKG None  Radiology No results found.  Procedures Procedures (including critical care time)  Medications Ordered in ED Medications  acetaminophen (TYLENOL) tablet 650 mg (650 mg Oral Not Given 05/12/20 2026)  0.9 %  sodium chloride infusion ( Intravenous Stopped 05/12/20 2153)  sodium chloride 0.9 % bolus 1,000 mL (0 mLs Intravenous Stopped 05/12/20 1749)  ondansetron (ZOFRAN) injection 4 mg (4 mg Intravenous Given 05/12/20 1639)  promethazine (PHENERGAN) injection 25 mg (25 mg Intravenous Given 05/12/20 1707)  LORazepam (ATIVAN) injection 1 mg (1 mg Intravenous Given 05/12/20 1830)  potassium chloride 10 mEq in 100 mL IVPB (0 mEq Intravenous Stopped 05/12/20 1931)    ED Course  I have reviewed the triage vital signs and the nursing notes.  Pertinent labs & imaging results that were available during my care of the patient were reviewed by me and considered in my medical decision making (see chart for details).    MDM Rules/Calculators/A&P                          Renie Stelmach is a 30 y.o. female with pertinent past medical history of UTI that presents the emergency department today for  nausea, vomiting, epigastric pain.  Patient is only complaining of epigastric pain when she vomits, very mild epigastric pain on abdominal exam.  No guarding, no peritoneal signs. No need for CT at this time, pt without any tachycardia, afebrile with normal WBC.  Patient is vomiting, will give Zofran and fluids and reevaluate.  Work-up today reassuring, CBC unremarkable.  CMP with potassium of 3.2, did replete this in the ER.  Urinalysis does show ketones, think patient is dehydrated.  Patient has received bolus and infusion of fluids.  Normal lipase.  No concerns for pancreatitis at this time or other acute intra-abdominal pathology, no guarding or peritoneal signs on exam with normal WBC..  Patient is afebrile and nontachycardic.  Patient has not vomited again, however still feels nauseous.  Will give Ativan at this time.  Patient has not vomited over the past 3 hours now, patient passed p.o. challenge.  Patient symptoms are most likely from Covid, did discuss this in depth with patient.  Repeat exam without any abdominal tenderness, patient appears much better than when she came in.  Patient needs to continue to stay hydrated.  Symptomatic treatment discussed, patient will follow up with PCP.  Did express to patient if patient symptoms continue or she has any new worsening concerning symptoms to come back to the emergency department.  Doubt need for further emergent work up at this time. I explained the diagnosis and have given explicit precautions to return to the ER including for any other new or worsening symptoms. The patient understands and accepts the medical plan as it's been dictated and I have answered their questions. Discharge instructions concerning home care and prescriptions have been given. The patient is STABLE and is discharged to home in good condition.  Final Clinical Impression(s) / ED Diagnoses Final diagnoses:  COVID  Non-intractable vomiting with nausea, unspecified vomiting type     Rx / DC Orders ED Discharge Orders         Ordered    ondansetron (ZOFRAN ODT)  4 MG disintegrating tablet  Every 8 hours PRN        05/12/20 2024           Farrel Gordon, PA-C 05/13/20 0030    Tilden Fossa, MD 05/13/20 [phone]

## 2020-05-12 NOTE — Progress Notes (Signed)
EKG completed, printed and to Dr. Madilyn Hook at approximately 1712.

## 2020-05-13 MED FILL — ONDANSETRON ODT 4 MG TABLET: 4 | 6 days supply | Qty: 20 | Fill #0

## 2020-07-08 ENCOUNTER — Other Ambulatory Visit: Payer: Self-pay

## 2020-07-08 ENCOUNTER — Encounter (HOSPITAL_BASED_OUTPATIENT_CLINIC_OR_DEPARTMENT_OTHER): Payer: Self-pay | Admitting: *Deleted

## 2020-07-08 ENCOUNTER — Emergency Department (HOSPITAL_BASED_OUTPATIENT_CLINIC_OR_DEPARTMENT_OTHER): Payer: Self-pay

## 2020-07-08 ENCOUNTER — Emergency Department (HOSPITAL_BASED_OUTPATIENT_CLINIC_OR_DEPARTMENT_OTHER)
Admission: EM | Admit: 2020-07-08 | Discharge: 2020-07-08 | Disposition: A | Discharge status: Home / Self Care | Payer: Self-pay | Attending: Emergency Medicine | Admitting: Emergency Medicine

## 2020-07-08 DIAGNOSIS — M65849 Other synovitis and tenosynovitis, unspecified hand: Secondary | ICD-10-CM | POA: Insufficient documentation

## 2020-07-08 DIAGNOSIS — M659 Synovitis and tenosynovitis, unspecified: Secondary | ICD-10-CM

## 2020-07-08 MED ORDER — IBUPROFEN 400 MG PO TABS
600.0000 mg | ORAL_TABLET | Freq: Once | ORAL | Status: AC
  Administered 2020-07-08: 600 mg via ORAL
  Filled 2020-07-08: qty 1

## 2020-07-08 NOTE — ED Triage Notes (Signed)
Right hand pain for a week. No known injury.

## 2020-07-08 NOTE — ED Notes (Signed)
Pt fitted with splint , was upset that it  Was taking so long for d/c and that we could not tell her EXACTLY what was wrong with her hand and arm

## 2020-07-08 NOTE — ED Provider Notes (Signed)
[facility] EMERGENCY DEPARTMENT Provider Note   CSN: [id number] Arrival date & time: 07/08/20  1159     History Chief Complaint  Patient presents with  . Hand Pain    Mary Peters is a 30 y.o. female.  HPI 30 year old female presents with right hand pain.  This has been ongoing for about 2 weeks.  Recently is getting worse to the point that it makes her forearm hurt.  Does not remember any type of injury.  No fevers or systemic symptoms.  Pain is primarily at the base of her hands on both sides.  No numbness.  Has not take anything.  History reviewed. No pertinent past medical history.  Patient Active Problem List   Diagnosis Date Noted  . 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 Cervical paraspinal muscle spasm 10/25/2016    History reviewed. No pertinent surgical history.   OB History   No obstetric history on file.     Family History  Problem Relation Age of Onset  . Diabetes Mother   . COPD Maternal Grandmother   . Stomach cancer Maternal Grandfather     Social History   Tobacco Use  . Smoking status: Never Smoker  . Smokeless tobacco: Never Used  Vaping Use  . Vaping Use: Never used  Substance Use Topics  . Alcohol use: No  . Drug use: Yes    Types: Marijuana    Home Medications Prior to Admission medications   Medication Sig Start Date End Date Taking? Authorizing Provider  benztropine (COGENTIN) 1 MG tablet Take 1 tablet (1 mg total) by mouth 2 (two) times daily for 14 days. 09/14/19 09/28/19  Gailen Shelter, PA  diphenhydrAMINE (BENADRYL) 25 MG tablet Take 1 tablet (25 mg total) by mouth every 6 (six) hours as needed. 09/14/19   Fondaw, Wylder S, PA  ondansetron (ZOFRAN ODT) 4 MG disintegrating tablet Take 1 tablet (4 mg total) by mouth every 8 (eight) hours as needed for nausea or vomiting. 05/12/20   Farrel Gordon, PA-C  prochlorperazine (COMPAZINE) 10 MG tablet Take 1  tablet (10 mg total) by mouth 2 (two) times daily as needed for nausea or vomiting. 09/13/19   Pollyann Savoy, MD  promethazine (PHENERGAN) 12.5 MG tablet Take 2 tablets (25 mg total) by mouth every 8 (eight) hours as needed for nausea or vomiting. 09/06/17 09/13/19  Maczis, Elmer Sow, PA-C    Allergies    Haldol [haloperidol]  Review of Systems   Review of Systems  Constitutional: Negative for fever.  Musculoskeletal: Positive for arthralgias and joint swelling.  Neurological: Negative for numbness.    Physical Exam Updated Vital Signs BP 103/77 (BP Location: Left Arm)   Pulse 71   Temp 98.2 F (36.8 C) (Oral)   Resp 18   Ht 5' (1.524 m)   Wt 58.5 kg   LMP 07/02/2020   SpO2 93%   BMI 25.17 kg/m   Physical Exam Vitals and nursing note reviewed.  Constitutional:      General: She is not in acute distress.    Appearance: She is well-developed and well-nourished. She is not ill-appearing or diaphoretic.  HENT:     Head: Normocephalic and atraumatic.     Right Ear: External ear normal.     Left Ear: External ear normal.     Nose: Nose normal.  Eyes:     General:        Right eye: No  discharge.        Left eye: No discharge.  Cardiovascular:     Rate and Rhythm: Normal rate and regular rhythm.     Pulses:          Radial pulses are 2+ on the right side.  Pulmonary:     Effort: Pulmonary effort is normal.  Abdominal:     General: There is no distension.  Musculoskeletal:     Right wrist: No tenderness. Normal range of motion.     Right hand: Swelling (at base of right thumb on palmar aspect) and tenderness (base of palm bilaterally) present. Normal range of motion.     Comments: Lourena Simmonds test induces pain but on the ulnar aspect of her hand Full ROM of right hand. Normal gross sensation of fingers/hand  Skin:    General: Skin is warm and dry.  Neurological:     Mental Status: She is alert.  Psychiatric:        Mood and Affect: Mood is not anxious.     ED  Results / Procedures / Treatments   Labs (all labs ordered are listed, but only abnormal results are displayed) Labs Reviewed - No data to display  EKG None  Radiology DG Hand Complete Right  Result Date: 07/08/2020 CLINICAL DATA:  Right base of thumb pain.  No known injury. EXAM: RIGHT HAND - COMPLETE 3+ VIEW COMPARISON:  None. FINDINGS: No acute fracture or dislocation. Normal bony mineralization. Joint spaces are maintained. No bony erosion or periostitis. No evidence of other focal bone abnormality. No abnormal soft tissue mineralization. No focal soft tissue swelling. IMPRESSION: Negative. Electronically Signed   By: Duanne Guess D.O.   On: 07/08/2020 13:13    Procedures Procedures   Medications Ordered in ED Medications  ibuprofen (ADVIL) tablet 600 mg (600 mg Oral Given 07/08/20 1317)    ED Course  I have reviewed the triage vital signs and the nursing notes.  Pertinent labs & imaging results that were available during my care of the patient were reviewed by me and considered in my medical decision making (see chart for details).    MDM Rules/Calculators/A&P                          Unclear cause of her atraumatic multiweek hand pain.  Perhaps this is de Quervain's tenosynovitis given the location but it seems to be bilateral as much as it is swollen in the right thenar hand.  Given this we will place in wrist splint, recommend RICE, and refer to sports medicine.  No signs of this is acutely infected.  No obvious neurovascular compromise. Final Clinical Impression(s) / ED Diagnoses Final diagnoses:  Tenosynovitis of hand    Rx / DC Orders ED Discharge Orders    None       Pricilla Loveless, MD 07/08/20 1414

## 2020-07-12 ENCOUNTER — Ambulatory Visit: Payer: Self-pay | Admitting: Family Medicine

## 2020-07-12 NOTE — Progress Notes (Deleted)
  Mary Peters - 30 y.o. female MRN [id number]  Date of birth: October 30, 1990  SUBJECTIVE:  Including CC & ROS.  No chief complaint on file.   Mary Peters is a 30 y.o. female that is  ***.  ***   Review of Systems See HPI   HISTORY: Past Medical, Surgical, Social, and Family History Reviewed & Updated per EMR.   Pertinent Historical Findings include:  No past medical history on file.  No past surgical history on file.  Family History  Problem Relation Age of Onset  . Diabetes Mother   . COPD Maternal Grandmother   . Stomach cancer Maternal Grandfather     Social History   Socioeconomic History  . Marital status: Single    Spouse name: Not on file  . Number of children: Not on file  . Years of education: Not on file  . Highest education level: Not on file  Occupational History  . Not on file  Tobacco Use  . Smoking status: Never Smoker  . Smokeless tobacco: Never Used  Vaping Use  . Vaping Use: Never used  Substance and Sexual Activity  . Alcohol use: No  . Drug use: Yes    Types: Marijuana  . Sexual activity: Not on file  Other Topics Concern  . Not on file  Social History Narrative  . Not on file   Social Determinants of Health   Financial Resource Strain: Not on file  Food Insecurity: Not on file  Transportation Needs: Not on file  Physical Activity: Not on file  Stress: Not on file  Social Connections: Not on file  Intimate Partner Violence: Not on file     PHYSICAL EXAM:  VS: LMP 07/02/2020  Physical Exam Gen: NAD, alert, cooperative with exam, well-appearing MSK:  ***      ASSESSMENT & PLAN:   No problem-specific Assessment & Plan notes found for this encounter.

## 2020-08-04 ENCOUNTER — Other Ambulatory Visit: Payer: Self-pay

## 2020-08-04 ENCOUNTER — Emergency Department (HOSPITAL_BASED_OUTPATIENT_CLINIC_OR_DEPARTMENT_OTHER): Payer: Self-pay

## 2020-08-04 ENCOUNTER — Encounter (HOSPITAL_BASED_OUTPATIENT_CLINIC_OR_DEPARTMENT_OTHER): Payer: Self-pay

## 2020-08-04 ENCOUNTER — Observation Stay (HOSPITAL_BASED_OUTPATIENT_CLINIC_OR_DEPARTMENT_OTHER)
Admission: EM | Admit: 2020-08-04 | Discharge: 2020-08-05 | Disposition: A | Discharge status: Home / Self Care | Payer: Self-pay | Attending: Internal Medicine | Admitting: Internal Medicine

## 2020-08-04 DIAGNOSIS — R112 Nausea with vomiting, unspecified: Principal | ICD-10-CM | POA: Insufficient documentation

## 2020-08-04 DIAGNOSIS — E872 Acidosis, unspecified: Secondary | ICD-10-CM

## 2020-08-04 DIAGNOSIS — E876 Hypokalemia: Secondary | ICD-10-CM | POA: Insufficient documentation

## 2020-08-04 DIAGNOSIS — Z20822 Contact with and (suspected) exposure to covid-19: Secondary | ICD-10-CM | POA: Insufficient documentation

## 2020-08-04 DIAGNOSIS — F1290 Cannabis use, unspecified, uncomplicated: Secondary | ICD-10-CM

## 2020-08-04 DIAGNOSIS — R109 Unspecified abdominal pain: Secondary | ICD-10-CM

## 2020-08-04 LAB — URINALYSIS, ROUTINE W REFLEX MICROSCOPIC
Glucose, UA: NEGATIVE mg/dL
Ketones, ur: >80 mg/dL — AB
Leukocytes,Ua: NEGATIVE
Nitrite: NEGATIVE
Protein, ur: NEGATIVE mg/dL
Specific Gravity, Urine: 1.025 (ref 1.005–1.030)
pH: 6 (ref 5.0–8.0)

## 2020-08-04 LAB — COMPREHENSIVE METABOLIC PANEL
ALT: 20 U/L (ref 0–44)
AST: 26 U/L (ref 15–41)
Albumin: 4.5 g/dL (ref 3.5–5.0)
Alkaline Phosphatase: 48 U/L (ref 38–126)
Anion gap: 12 (ref 5–15)
BUN: 7 mg/dL (ref 6–20)
CO2: 19 mmol/L — ABNORMAL LOW (ref 22–32)
Calcium: 9.3 mg/dL (ref 8.9–10.3)
Chloride: 107 mmol/L (ref 98–111)
Creatinine, Ser: 0.74 mg/dL (ref 0.44–1.00)
GFR, Estimated: >60 mL/min (ref >60)
Glucose, Bld: 123 mg/dL — ABNORMAL HIGH (ref 70–99)
Potassium: 3 mmol/L — ABNORMAL LOW (ref 3.5–5.1)
Sodium: 138 mmol/L (ref 135–145)
Total Bilirubin: 0.8 mg/dL (ref 0.3–1.2)
Total Protein: 7.8 g/dL (ref 6.5–8.1)

## 2020-08-04 LAB — RESP PANEL BY RT-PCR (FLU A&B, COVID) ARPGX2
Influenza A by PCR: NEGATIVE
Influenza B by PCR: NEGATIVE
SARS Coronavirus 2 by RT PCR: NEGATIVE

## 2020-08-04 LAB — URINALYSIS, MICROSCOPIC (REFLEX)

## 2020-08-04 LAB — CBC WITH DIFFERENTIAL/PLATELET
Abs Immature Granulocytes: 0.03 10*3/uL (ref 0.00–0.07)
Basophils Absolute: 0 10*3/uL (ref 0.0–0.1)
Basophils Relative: 0 %
Eosinophils Absolute: 0 10*3/uL (ref 0.0–0.5)
Eosinophils Relative: 0 %
HCT: 36.6 % (ref 36.0–46.0)
Hemoglobin: 12.7 g/dL (ref 12.0–15.0)
Immature Granulocytes: 0 %
Lymphocytes Relative: 14 %
Lymphs Abs: 1.2 10*3/uL (ref 0.7–4.0)
MCH: 30.1 pg (ref 26.0–34.0)
MCHC: 34.7 g/dL (ref 30.0–36.0)
MCV: 86.7 fL (ref 80.0–100.0)
Monocytes Absolute: 0.3 10*3/uL (ref 0.1–1.0)
Monocytes Relative: 3 %
Neutro Abs: 7 10*3/uL (ref 1.7–7.7)
Neutrophils Relative %: 83 %
Platelets: 325 10*3/uL (ref 150–400)
RBC: 4.22 MIL/uL (ref 3.87–5.11)
RDW: 12.6 % (ref 11.5–15.5)
WBC: 8.5 10*3/uL (ref 4.0–10.5)
nRBC: 0 % (ref 0.0–0.2)

## 2020-08-04 LAB — RAPID URINE DRUG SCREEN, HOSP PERFORMED
Amphetamines: NOT DETECTED
Barbiturates: NOT DETECTED
Benzodiazepines: NOT DETECTED
Cocaine: NOT DETECTED
Opiates: NOT DETECTED
Tetrahydrocannabinol: POSITIVE — AB

## 2020-08-04 LAB — PREGNANCY, URINE: Preg Test, Ur: NEGATIVE

## 2020-08-04 LAB — LIPASE, BLOOD: Lipase: 31 U/L (ref 11–51)

## 2020-08-04 MED ORDER — POTASSIUM CHLORIDE 10 MEQ/100ML IV SOLN
10.0000 meq | INTRAVENOUS | Status: AC
  Administered 2020-08-05 (×3): 10 meq via INTRAVENOUS
  Filled 2020-08-04 (×3): qty 100

## 2020-08-04 MED ORDER — ACETAMINOPHEN 325 MG PO TABS: 650.0000 mg | ORAL_TABLET | Freq: Four times a day (QID) | ORAL | Status: DC | PRN

## 2020-08-04 MED ORDER — SODIUM CHLORIDE 0.9 % IV BOLUS
1000.0000 mL | Freq: Once | INTRAVENOUS | Status: AC
  Administered 2020-08-04: 1000 mL via INTRAVENOUS

## 2020-08-04 MED ORDER — IOHEXOL 300 MG/ML  SOLN
100.0000 mL | Freq: Once | INTRAMUSCULAR | Status: AC | PRN
  Administered 2020-08-04: 100 mL via INTRAVENOUS

## 2020-08-04 MED ORDER — MORPHINE SULFATE (PF) 2 MG/ML IV SOLN
1.0000 mg | INTRAVENOUS | Status: DC | PRN
  Administered 2020-08-05 (×2): 1 mg via INTRAVENOUS
  Filled 2020-08-04 (×2): qty 1

## 2020-08-04 MED ORDER — SODIUM CHLORIDE 0.9 % IV SOLN
25.0000 mg | Freq: Once | INTRAVENOUS | Status: DC
  Filled 2020-08-04: qty 1

## 2020-08-04 MED ORDER — PANTOPRAZOLE SODIUM 40 MG IV SOLR
40.0000 mg | INTRAVENOUS | Status: DC
  Administered 2020-08-05: 40 mg via INTRAVENOUS
  Filled 2020-08-04: qty 40

## 2020-08-04 MED ORDER — SODIUM CHLORIDE 0.9 % IV SOLN
INTRAVENOUS | Status: DC | PRN
  Administered 2020-08-04: 250 mL via INTRAVENOUS

## 2020-08-04 MED ORDER — FENTANYL CITRATE (PF) 100 MCG/2ML IJ SOLN
50.0000 ug | Freq: Once | INTRAMUSCULAR | Status: AC
  Administered 2020-08-04: 50 ug via INTRAVENOUS
  Filled 2020-08-04: qty 2

## 2020-08-04 MED ORDER — PROMETHAZINE HCL 25 MG/ML IJ SOLN
INTRAMUSCULAR | Status: AC
  Filled 2020-08-04: qty 1

## 2020-08-04 MED ORDER — SODIUM CHLORIDE 0.9 % IV SOLN
25.0000 mg | Freq: Four times a day (QID) | INTRAVENOUS | Status: DC | PRN
  Administered 2020-08-05 (×2): 25 mg via INTRAVENOUS
  Filled 2020-08-04 (×3): qty 1

## 2020-08-04 MED ORDER — SODIUM CHLORIDE 0.9 % IV BOLUS
1000.0000 mL | Freq: Once | INTRAVENOUS | Status: AC
  Administered 2020-08-04: 250 mL via INTRAVENOUS

## 2020-08-04 MED ORDER — KETOROLAC TROMETHAMINE 30 MG/ML IJ SOLN
30.0000 mg | Freq: Three times a day (TID) | INTRAMUSCULAR | Status: DC | PRN
  Administered 2020-08-04: 30 mg via INTRAVENOUS
  Filled 2020-08-04: qty 1

## 2020-08-04 MED ORDER — SODIUM CHLORIDE 0.9 % IV SOLN: INTRAVENOUS | Status: DC

## 2020-08-04 MED ORDER — KETOROLAC TROMETHAMINE 30 MG/ML IJ SOLN
30.0000 mg | Freq: Once | INTRAMUSCULAR | Status: AC
  Administered 2020-08-04: 30 mg via INTRAVENOUS
  Filled 2020-08-04: qty 1

## 2020-08-04 MED ORDER — ACETAMINOPHEN 650 MG RE SUPP: 650.0000 mg | Freq: Four times a day (QID) | RECTAL | Status: DC | PRN

## 2020-08-04 MED ORDER — POTASSIUM CHLORIDE 10 MEQ/100ML IV SOLN
10.0000 meq | Freq: Once | INTRAVENOUS | Status: AC
  Administered 2020-08-04: 10 meq via INTRAVENOUS
  Filled 2020-08-04: qty 100

## 2020-08-04 MED ORDER — SODIUM CHLORIDE 0.9 % IV SOLN
25.0000 mg | Freq: Once | INTRAVENOUS | Status: AC
  Administered 2020-08-04: 25 mg via INTRAVENOUS
  Filled 2020-08-04: qty 1

## 2020-08-04 MED ORDER — ENOXAPARIN SODIUM 40 MG/0.4ML ~~LOC~~ SOLN
40.0000 mg | SUBCUTANEOUS | Status: DC
  Administered 2020-08-05: 40 mg via SUBCUTANEOUS
  Filled 2020-08-04: qty 0.4

## 2020-08-04 NOTE — ED Notes (Signed)
Handoff report given to [facility] and to [facility] RN at [facility]

## 2020-08-04 NOTE — ED Notes (Signed)
Pt back from CT scan

## 2020-08-04 NOTE — ED Notes (Signed)
Vital signs stable. 

## 2020-08-04 NOTE — ED Notes (Signed)
Patient is resting comfortably. 

## 2020-08-04 NOTE — ED Notes (Signed)
[facility] at bedside 

## 2020-08-04 NOTE — ED Notes (Signed)
Complaints of nausea and vomiting for three days. Active vomiting small amounts Pain to abd left side  Tender with palpation ,  States SOB Lungs clear

## 2020-08-04 NOTE — ED Notes (Signed)
Pt given ginger ale as drink challenge   After drinking vomited and c/o pain.

## 2020-08-04 NOTE — Progress Notes (Signed)
Patient just arrived to the unit from [facility]. Paged admissions.

## 2020-08-04 NOTE — ED Notes (Signed)
No vomiting at present.  States nausea better.

## 2020-08-04 NOTE — H&P (Addendum)
History and Physical    Mary Peters ZOX:[id number] DOB: [birth year] DOA: 08/04/2020  PCP: Pcp, No Patient coming from: [facility]  Chief Complaint: Abdominal pain, vomiting  HPI: Mary Peters is a 30 y.o. female with a past medical history of marijuana use and no other significant documented medical conditions presented to the ED with complaints of abdominal pain, nausea, and vomiting.  At [facility] ED, vital signs stable.  Labs showing WBC 8.5, hemoglobin 12.7, hematocrit 36.6, platelet count 325K.  Sodium 138, potassium 3.0, chloride 107, bicarb 19, anion gap 12, BUN 7, creatinine 0.7, glucose 123.  LFTs normal.  Lipase normal.  UA without signs of infection.  Urine pregnancy test negative.  UDS positive for THC.  SARS-CoV-2 PCR test negative.  Influenza panel negative.  CT negative for acute intra-abdominal or pelvic pathology to explain the patient's symptoms. Medications administered include fentanyl, Toradol, Phenergan, IV potassium 10 mEq, and 2 L normal saline boluses.    Patient reports 3-day history of severe left-sided, upper and lower quadrant abdominal pain associated with nausea and vomiting.  States she has been vomiting continuously and has not been able to eat anything.  States she was previously smoking marijuana but has not done so in the past 3 weeks.  Patient is requesting pain medication.  Review of Systems:  All systems reviewed and apart from history of presenting illness, are negative.  History reviewed. No pertinent past medical history.  History reviewed. No pertinent surgical history.   reports that she has never smoked. She has never used smokeless tobacco. She reports current drug use. Drug: Marijuana. She reports that she does not drink alcohol.  Allergies  Allergen Reactions  . Haloperidol Swelling and Anaphylaxis    Family History  Problem Relation Age of Onset  . Diabetes Mother   . COPD Maternal Grandmother   . Stomach cancer Maternal  Grandfather     Prior to Admission medications   Medication Sig Start Date End Date Taking? Authorizing Provider  promethazine (PHENERGAN) 12.5 MG tablet Take 2 tablets (25 mg total) by mouth every 8 (eight) hours as needed for nausea or vomiting. 09/06/17 09/13/19  Jacinto Halim, PA-C    Physical Exam: Vitals:   08/04/20 1830 08/04/20 2058 08/04/20 2232 08/04/20 2250  BP: 116/74 108/70 137/90   Pulse: 79 87 80   Resp: 19  17   Temp:  98.5 F (36.9 C) 98.5 F (36.9 C)   TempSrc:  Oral Oral   SpO2: 100% 96% 100%   Weight:    58 kg  Height:    5' (1.524 m)    Physical Exam Constitutional:      General: She is not in acute distress. HENT:     Head: Normocephalic and atraumatic.  Eyes:     Extraocular Movements: Extraocular movements intact.     Conjunctiva/sclera: Conjunctivae normal.  Cardiovascular:     Rate and Rhythm: Normal rate and regular rhythm.     Pulses: Normal pulses.  Pulmonary:     Effort: Pulmonary effort is normal. No respiratory distress.     Breath sounds: Normal breath sounds. No wheezing or rales.  Abdominal:     General: Bowel sounds are normal. There is no distension.     Palpations: Abdomen is soft.     Tenderness: There is no abdominal tenderness. There is no guarding or rebound.  Musculoskeletal:        General: No swelling or tenderness.     Cervical back: Normal  range of motion and neck supple.  Skin:    General: Skin is warm and dry.  Neurological:     General: No focal deficit present.     Mental Status: She is alert and oriented to person, place, and time.     Labs on Admission: I have personally reviewed following labs and imaging studies  CBC: Recent Labs  Lab 08/04/20 1552  WBC 8.5  NEUTROABS 7.0  HGB 12.7  HCT 36.6  MCV 86.7  PLT 325   Basic Metabolic Panel: Recent Labs  Lab 08/04/20 1552  NA 138  K 3.0*  CL 107  CO2 19*  GLUCOSE 123*  BUN 7  CREATININE 0.74  CALCIUM 9.3   GFR: Estimated Creatinine  Clearance: 82.7 mL/min (by C-G formula based on SCr of 0.74 mg/dL). Liver Function Tests: Recent Labs  Lab 08/04/20 1552  AST 26  ALT 20  ALKPHOS 48  BILITOT 0.8  PROT 7.8  ALBUMIN 4.5   Recent Labs  Lab 08/04/20 1552  LIPASE 31   No results for input(s): AMMONIA in the last 168 hours. Coagulation Profile: No results for input(s): INR, PROTIME in the last 168 hours. Cardiac Enzymes: No results for input(s): CKTOTAL, CKMB, CKMBINDEX, TROPONINI in the last 168 hours. BNP (last 3 results) No results for input(s): PROBNP in the last 8760 hours. HbA1C: No results for input(s): HGBA1C in the last 72 hours. CBG: No results for input(s): GLUCAP in the last 168 hours. Lipid Profile: No results for input(s): CHOL, HDL, LDLCALC, TRIG, CHOLHDL, LDLDIRECT in the last 72 hours. Thyroid Function Tests: No results for input(s): TSH, T4TOTAL, FREET4, T3FREE, THYROIDAB in the last 72 hours. Anemia Panel: No results for input(s): VITAMINB12, FOLATE, FERRITIN, TIBC, IRON, RETICCTPCT in the last 72 hours. Urine analysis:    Component Value Date/Time   COLORURINE YELLOW 08/04/2020 1536   APPEARANCEUR CLEAR 08/04/2020 1536   LABSPEC 1.025 08/04/2020 1536   PHURINE 6.0 08/04/2020 1536   GLUCOSEU NEGATIVE 08/04/2020 1536   HGBUR LARGE (A) 08/04/2020 1536   BILIRUBINUR SMALL (A) 08/04/2020 1536   KETONESUR >80 (A) 08/04/2020 1536   PROTEINUR NEGATIVE 08/04/2020 1536   NITRITE NEGATIVE 08/04/2020 1536   LEUKOCYTESUR NEGATIVE 08/04/2020 1536    Radiological Exams on Admission: CT Abdomen Pelvis W Contrast  Result Date: 08/04/2020 CLINICAL DATA:  Abdominal pain EXAM: CT ABDOMEN AND PELVIS WITH CONTRAST TECHNIQUE: Multidetector CT imaging of the abdomen and pelvis was performed using the standard protocol following bolus administration of intravenous contrast. CONTRAST:  OMNIPAQUE IOHEXOL 300 MG/ML  SOLN COMPARISON:  October 18, 2019 FINDINGS: Lower chest: The visualized heart size within  normal limits. No pericardial fluid/thickening. No hiatal hernia. The visualized portions of the lungs are clear. Hepatobiliary: The liver is normal in density without focal abnormality.The main portal vein is patent. No evidence of calcified gallstones, gallbladder wall thickening or biliary dilatation. Pancreas: Unremarkable. No pancreatic ductal dilatation or surrounding inflammatory changes. Spleen: Normal in size without focal abnormality. Adrenals/Urinary Tract: Both adrenal glands appear normal. There is a 1 cm low-density lesion seen in the upper pole of the left kidney. The right kidney is unremarkable. Bladder is unremarkable. Stomach/Bowel: The stomach, small bowel, and colon are normal in appearance. No inflammatory changes, wall thickening, or obstructive findings.The appendix is normal. Vascular/Lymphatic: There are no enlarged mesenteric, retroperitoneal, or pelvic lymph nodes. No significant vascular findings are present. Reproductive: A small peripherally enhancing probable uterine fibroid seen in the posterior right fundus. Other: A fat containing  anterior umbilical hernia is present. Musculoskeletal: No acute or significant osseous findings. IMPRESSION: No acute intra-abdominal or pelvic pathology to explain the patient's symptoms Uterine fibroid Electronically Signed   By: Jonna Clark M.D.   On: 08/04/2020 16:51   Assessment/Plan Principal Problem:   Nausea and vomiting Active Problems:   Abdominal pain   Hypokalemia   Metabolic acidosis   Abdominal pain Intractable nausea and vomiting Possibly related to cannabis use.  UDS positive for THC but patient states she has not smoked marijuana for 3 weeks.  No leukocytosis on labs.  Lipase and LFTs normal.  UA without signs of infection.  Urine pregnancy test negative.  SARS-CoV-2 PCR test negative.  Influenza panel negative.  Abdominal exam benign.  CT negative for acute intra-abdominal or pelvic pathology to explain the patient's  symptoms. -Symptomatic management: IV fluid hydration, Phenergan as needed for nausea/vomiting, Toradol as needed for pain.  EKG has been ordered to check QT interval.  Addendum: Morphine as needed for pain.  Trial of PPI for possible gastritis.  Mild hypokalemia -Monitor potassium and magnesium levels, replenish as needed.  Mild normal anion gap metabolic acidosis -Check lactic acid level.  IV fluid hydration, continue to monitor.  HIV screening The patient falls between the ages of 13-64 and should be screened for HIV, therefore HIV testing ordered.  DVT prophylaxis: Lovenox Code Status: Full code Family Communication: No family at bedside. Disposition Plan: Status is: Observation  The patient remains OBS appropriate and will d/c before 2 midnights.  Dispo: The patient is from: Home              Anticipated d/c is to: Home              Patient currently is not medically stable to d/c.   Difficult to place patient No  Level of care: Level of care: Med-Surg   The medical decision making on this patient was of high complexity and the patient is at high risk for clinical deterioration, therefore this is a level 3 visit.  John Giovanni MD [facility]  If 7PM-7AM, please contact night-coverage [email or web address]  08/04/2020, 11:11 PM

## 2020-08-04 NOTE — ED Triage Notes (Signed)
Pt c/o abd pain, n/v x 3 days-seen yesterday at [facility] ED yesterday for same-NAD-steady gait

## 2020-08-04 NOTE — ED Provider Notes (Signed)
[facility] EMERGENCY DEPARTMENT Provider Note   CSN: [id number] Arrival date & time: 08/04/20  1525     History Chief Complaint  Patient presents with  . Abdominal Pain    Mary Peters is a 30 y.o. female.  She was seen at an ER yesterday and was given 1 L of IV fluids.  She was discharged home with a prescription for promethazine, but she states that she cannot keep it down.  The history is provided by the patient.  Abdominal Pain Pain location:  LUQ Pain quality: sharp   Pain radiates to:  Does not radiate Pain severity:  Moderate Onset quality:  Gradual Duration:  3 days Timing:  Constant Progression:  Unchanged Chronicity:  New Context: not sick contacts, not suspicious food intake and not trauma   Relieved by:  Nothing Worsened by:  Nothing Associated symptoms: nausea and vomiting   Associated symptoms: no chest pain, no chills, no cough, no diarrhea, no dysuria, no fever, no hematuria, no shortness of breath and no sore throat   Associated symptoms comment:  States she has had nonstop vomiting for 3 days. Risk factors: has not had multiple surgeries        History reviewed. No pertinent past medical history.  Patient Active Problem List   Diagnosis Date Noted  . 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 Cervical paraspinal muscle spasm 10/25/2016    History reviewed. No pertinent surgical history.   OB History   No obstetric history on file.     Family History  Problem Relation Age of Onset  . Diabetes Mother   . COPD Maternal Grandmother   . Stomach cancer Maternal Grandfather     Social History   Tobacco Use  . Smoking status: Never Smoker  . Smokeless tobacco: Never Used  Vaping Use  . Vaping Use: Never used  Substance Use Topics  . Alcohol use: No  . Drug use: Yes    Types: Marijuana    Home Medications Prior to Admission medications   Medication Sig  Start Date End Date Taking? Authorizing Provider  benztropine (COGENTIN) 1 MG tablet Take 1 tablet (1 mg total) by mouth 2 (two) times daily for 14 days. 09/14/19 09/28/19  Gailen Shelter, PA  diphenhydrAMINE (BENADRYL) 25 MG tablet Take 1 tablet (25 mg total) by mouth every 6 (six) hours as needed. 09/14/19   Fondaw, Wylder S, PA  ondansetron (ZOFRAN ODT) 4 MG disintegrating tablet Take 1 tablet (4 mg total) by mouth every 8 (eight) hours as needed for nausea or vomiting. 05/12/20   Farrel Gordon, PA-C  prochlorperazine (COMPAZINE) 10 MG tablet Take 1 tablet (10 mg total) by mouth 2 (two) times daily as needed for nausea or vomiting. 09/13/19   Pollyann Savoy, MD  promethazine (PHENERGAN) 12.5 MG tablet Take 2 tablets (25 mg total) by mouth every 8 (eight) hours as needed for nausea or vomiting. 09/06/17 09/13/19  Maczis, Elmer Sow, PA-C    Allergies    Haldol [haloperidol]  Review of Systems   Review of Systems  Constitutional: Negative for chills and fever.  HENT: Negative for ear pain and sore throat.   Eyes: Negative for pain and visual disturbance.  Respiratory: Negative for cough and shortness of breath.   Cardiovascular: Negative for chest pain and palpitations.  Gastrointestinal: Positive for abdominal pain, nausea and vomiting. Negative for diarrhea.  Genitourinary: Negative for dysuria and hematuria.  Musculoskeletal: Negative  for arthralgias and back pain.  Skin: Negative for color change and rash.  Neurological: Negative for seizures and syncope.  All other systems reviewed and are negative.   Physical Exam Updated Vital Signs BP (!) 114/53 (BP Location: Left Arm)   Pulse 83   Temp 98.3 F (36.8 C) (Oral)   Resp 20   LMP 08/01/2020   SpO2 100%   Physical Exam Vitals and nursing note reviewed.  Constitutional:      General: She is not in acute distress.    Appearance: She is well-developed.  HENT:     Head: Normocephalic and atraumatic.     Mouth/Throat:     Comments:  dry Eyes:     Conjunctiva/sclera: Conjunctivae normal.  Cardiovascular:     Rate and Rhythm: Normal rate and regular rhythm.     Heart sounds: No murmur heard.   Pulmonary:     Effort: Pulmonary effort is normal. No respiratory distress.     Breath sounds: Normal breath sounds.  Abdominal:     General: Bowel sounds are decreased.     Palpations: Abdomen is soft.     Tenderness: There is abdominal tenderness in the left upper quadrant.  Musculoskeletal:     Cervical back: Neck supple.  Skin:    General: Skin is warm and dry.  Neurological:     General: No focal deficit present.     Mental Status: She is alert.  Psychiatric:        Mood and Affect: Mood normal.     ED Results / Procedures / Treatments   Labs (all labs ordered are listed, but only abnormal results are displayed) Labs Reviewed  URINALYSIS, ROUTINE W REFLEX MICROSCOPIC - Abnormal; Notable for the following components:      Result Value   Hgb urine dipstick LARGE (*)    Bilirubin Urine SMALL (*)    Ketones, ur >80 (*)    All other components within normal limits  COMPREHENSIVE METABOLIC PANEL - Abnormal; Notable for the following components:   Potassium 3.0 (*)    CO2 19 (*)    Glucose, Bld 123 (*)    All other components within normal limits  RAPID URINE DRUG SCREEN, HOSP PERFORMED - Abnormal; Notable for the following components:   Tetrahydrocannabinol POSITIVE (*)    All other components within normal limits  URINALYSIS, MICROSCOPIC (REFLEX) - Abnormal; Notable for the following components:   Bacteria, UA FEW (*)    All other components within normal limits  RESP PANEL BY RT-PCR (FLU A&B, COVID) ARPGX2  PREGNANCY, URINE  CBC WITH DIFFERENTIAL/PLATELET  LIPASE, BLOOD  HIV ANTIBODY (ROUTINE TESTING W REFLEX)  BASIC METABOLIC PANEL  MAGNESIUM    EKG None  Radiology CT Abdomen Pelvis W Contrast  Result Date: 08/04/2020 CLINICAL DATA:  Abdominal pain EXAM: CT ABDOMEN AND PELVIS WITH CONTRAST  TECHNIQUE: Multidetector CT imaging of the abdomen and pelvis was performed using the standard protocol following bolus administration of intravenous contrast. CONTRAST:  OMNIPAQUE IOHEXOL 300 MG/ML  SOLN COMPARISON:  October 18, 2019 FINDINGS: Lower chest: The visualized heart size within normal limits. No pericardial fluid/thickening. No hiatal hernia. The visualized portions of the lungs are clear. Hepatobiliary: The liver is normal in density without focal abnormality.The main portal vein is patent. No evidence of calcified gallstones, gallbladder wall thickening or biliary dilatation. Pancreas: Unremarkable. No pancreatic ductal dilatation or surrounding inflammatory changes. Spleen: Normal in size without focal abnormality. Adrenals/Urinary Tract: Both adrenal glands appear normal. There  is a 1 cm low-density lesion seen in the upper pole of the left kidney. The right kidney is unremarkable. Bladder is unremarkable. Stomach/Bowel: The stomach, small bowel, and colon are normal in appearance. No inflammatory changes, wall thickening, or obstructive findings.The appendix is normal. Vascular/Lymphatic: There are no enlarged mesenteric, retroperitoneal, or pelvic lymph nodes. No significant vascular findings are present. Reproductive: A small peripherally enhancing probable uterine fibroid seen in the posterior right fundus. Other: A fat containing anterior umbilical hernia is present. Musculoskeletal: No acute or significant osseous findings. IMPRESSION: No acute intra-abdominal or pelvic pathology to explain the patient's symptoms Uterine fibroid Electronically Signed   By: Jonna Clark M.D.   On: 08/04/2020 16:51    Procedures Procedures   Medications Ordered in ED Medications  sodium chloride 0.9 % bolus 1,000 mL (has no administration in time range)  promethazine (PHENERGAN) 25 mg in sodium chloride 0.9 % 50 mL IVPB (has no administration in time range)  fentaNYL (SUBLIMAZE) injection 50 mcg (has  no administration in time range)    ED Course  I have reviewed the triage vital signs and the nursing notes.  Pertinent labs & imaging results that were available during my care of the patient were reviewed by me and considered in my medical decision making (see chart for details).  Clinical Course as of 08/04/20 2317  Wed Aug 04, 2020  1858 I spoke to Dr. Allena Katz. [AW]    Clinical Course User Index [AW] Koleen Distance, MD    MDM Rules/Calculators/A&P                          Mary Peters presented with nausea, vomiting, and abdominal pain.  This is her second ED visit in 2 days, and she was noted to be dehydrated.  She had ketones in her urine.  I think her symptoms might be secondary to marijuana use.  However, she was fully evaluated for evidence of intra-abdominal pathology such as small bowel obstruction, diverticulitis, pancreatitis.  Unfortunately, she was still unable to tolerate liquids, and I recommended admission to the hospital. Final Clinical Impression(s) / ED Diagnoses Final diagnoses:  Intractable vomiting with nausea, unspecified vomiting type  Marijuana use    Rx / DC Orders ED Discharge Orders    None       Koleen Distance, MD 08/04/20 2318

## 2020-08-04 NOTE — ED Notes (Signed)
Cont to complain of pain and ongoing nausea, no active vomiting noted at this time. ED Provider notified

## 2020-08-05 DIAGNOSIS — R1084 Generalized abdominal pain: Secondary | ICD-10-CM

## 2020-08-05 DIAGNOSIS — E876 Hypokalemia: Secondary | ICD-10-CM

## 2020-08-05 DIAGNOSIS — E872 Acidosis: Secondary | ICD-10-CM

## 2020-08-05 LAB — LACTIC ACID, PLASMA
Lactic Acid, Venous: 0.6 mmol/L (ref 0.5–1.9)
Lactic Acid, Venous: 2.1 mmol/L (ref 0.5–1.9)

## 2020-08-05 LAB — BASIC METABOLIC PANEL
Anion gap: 12 (ref 5–15)
Anion gap: 9 (ref 5–15)
BUN: 5 mg/dL — ABNORMAL LOW (ref 6–20)
BUN: <5 mg/dL — ABNORMAL LOW (ref 6–20)
CO2: 17 mmol/L — ABNORMAL LOW (ref 22–32)
CO2: 19 mmol/L — ABNORMAL LOW (ref 22–32)
Calcium: 7.5 mg/dL — ABNORMAL LOW (ref 8.9–10.3)
Calcium: 8.4 mg/dL — ABNORMAL LOW (ref 8.9–10.3)
Chloride: 111 mmol/L (ref 98–111)
Chloride: 112 mmol/L — ABNORMAL HIGH (ref 98–111)
Creatinine, Ser: 0.68 mg/dL (ref 0.44–1.00)
Creatinine, Ser: 0.7 mg/dL (ref 0.44–1.00)
GFR, Estimated: >60 mL/min (ref >60)
GFR, Estimated: >60 mL/min (ref >60)
Glucose, Bld: 102 mg/dL — ABNORMAL HIGH (ref 70–99)
Glucose, Bld: 96 mg/dL (ref 70–99)
Potassium: 3 mmol/L — ABNORMAL LOW (ref 3.5–5.1)
Potassium: 3.2 mmol/L — ABNORMAL LOW (ref 3.5–5.1)
Sodium: 139 mmol/L (ref 135–145)
Sodium: 141 mmol/L (ref 135–145)

## 2020-08-05 LAB — MAGNESIUM: Magnesium: 1.8 mg/dL (ref 1.7–2.4)

## 2020-08-05 LAB — HIV ANTIBODY (ROUTINE TESTING W REFLEX): HIV Screen 4th Generation wRfx: NONREACTIVE

## 2020-08-05 MED ORDER — POTASSIUM CHLORIDE 10 MEQ/100ML IV SOLN
10.0000 meq | INTRAVENOUS | Status: AC
  Administered 2020-08-05 (×3): 10 meq via INTRAVENOUS
  Filled 2020-08-05: qty 100

## 2020-08-05 NOTE — Progress Notes (Signed)
Date and time results received: 08/05/20 00:20 (use smartphrase ".now" to insert current time)  Test:Lactic acid Critical Value: 2.1  Name of Provider Notified: Opyd

## 2020-08-05 NOTE — TOC Transition Note (Signed)
Transition of Care [facility]) - CM/SW Discharge Note  Patient Details  Name: Mary Peters MRN: [id number] Date of Birth: 1990/10/14  Transition of Care [facility]) CM/SW Contact:  Ewing Schlein, LCSW Phone Number: 08/05/2020, 2:24 PM  Clinical Narrative: Patient to discharge home today and in need of transportation home. Patient signed rider waiver form. CSW Dealer and needed information to [employer]. RN to call transportation once patient is ready. TOC signing off.  Final next level of care: Home/Self Care Barriers to Discharge: Barriers Resolved  Patient Goals and CMS Choice Patient states their goals for this hospitalization and ongoing recovery are:: Get transportation home Choice offered to / list presented to : NA  Discharge Plan and Services        DME Arranged: N/A DME Agency: NA  Readmission Risk Interventions No flowsheet data found.

## 2020-08-05 NOTE — Discharge Summary (Signed)
Physician Discharge Summary  Mary Gripbresha Peden ZOX:[id number] DOB: Jan 26, 1991 DOA: 08/04/2020  PCP: Pcp, No  Admit date: 08/04/2020 Discharge date: 08/05/2020  Admitted From: Home/med Center [facility] ED Disposition: Home  Recommendations for Outpatient Follow-up:  1. Follow up with PCP in 1-2 weeks 2. D[facility] need [facility], suspect she will not comply  Home Health: No Equipment/Devices: None  Discharge Condition: Stable CODE STATUS: Full code Diet recommendation: Bland diet  History of present illness:  Mary Peters is a 30 year old female with past medical history of chronic marijuana abuse who presented to the [facility] ED on 08/04/2020 with complaints of abdominal pain, nausea and vomiting.  Patient reports onset 3 days ago with severe left-sided upper and lower quadrant abdominal pain associate with nausea and vomiting.  Is unable to keep any solids or liquids down.  Patient endorses marijuana abuse, but states none in the last 3 weeks.  She is requesting pain medication.  In the ED at [facility], vital signs stable, afebrile.  WBC 8.5, hemoglobin 12.7, platelet count 325.  Sodium 138, potassium 3.0, chloride 107, bicarb 19, anion gap 12, BUN 7, creatinine 0.7, glucose 123.  LFTs within normal limits.  Lipase normal.  Urinalysis without signs of infection.  Urinary pregnancy test negative.  UDS positive for THC.  SARS-CoV-2 PCR test negative.  Influenza panel negative.  CT abdomen/pelvis negative for acute intra-abdominal or pelvic pathology to explain patient's symptoms.  Patient was given fentanyl, Toradol, Phenergan, IV potassium and 2 L NS bolus.  Hospital service consulted for further evaluation and management of intractable nausea/vomiting.  Hospital course:  Intractable nausea/vomiting secondary to cannabinoid hyperemesis syndrome Abdominal pain Lactic acidosis: Resolved Patient presenting to the ED with intractable nausea/vomiting  with associated left-sided upper/lower quadrant abdominal pain.  Patient is afebrile without leukocytosis.  LFTs within normal limits.  Urinary pregnancy test negative.  Lipase within normal limits.  CT abdomen/pelvis negative for acute intra-abdominal or pelvic pathology to explain patient's symptoms; although she has a fat-containing umbilical hernia with no bowel obstruction which would not cause her symptoms.  UDS is positive, and on review of EMR she remains positive on all UDS for THC.  Also patient has had multiple ED visits over the past year, 12 [facility] as well as [facility].  Suspect etiology of her symptoms related to her persistent marijuana abuse.  Lactic acid improved from 2.1-0.6 with IV fluid hydration.  Patient wishes to discharge home as she feels her concerns were not being addressed given that her abdomen is "poisoning" herself.  Suspect that she will continue to present to the ED multiple times as that is her habit pattern and she denies that her illicit drug use has anything to do with her presenting symptoms.  Also have high suspicion for malingering behavior given her constant request for IV pain medications and Phenergan.  Hypokalemia Repleted during hospitalization  Metabolic acidosis Bicarbonate level of 19, likely secondary to intractable nausea/vomiting as above.  Treated with supportive care, IV fluid hydration.  Discharge Diagnoses:  Principal Problem:   Nausea and vomiting Active Problems:   Abdominal pain   Hypokalemia   Metabolic acidosis    Discharge Instructions  Discharge Instructions    Diet - low sodium heart healthy   Complete by: As directed    Increase activity slowly   Complete by: As directed      Allergies as of 08/05/2020  Reactions   Haloperidol Swelling, Anaphylaxis      Medication List    You have not been prescribed any medications.     Allergies  Allergen Reactions  . Haloperidol  Swelling and Anaphylaxis    Consultations:  None   Procedures/Studies: CT Abdomen Pelvis W Contrast  Result Date: 08/04/2020 CLINICAL DATA:  Abdominal pain EXAM: CT ABDOMEN AND PELVIS WITH CONTRAST TECHNIQUE: Multidetector CT imaging of the abdomen and pelvis was performed using the standard protocol following bolus administration of intravenous contrast. CONTRAST:  OMNIPAQUE IOHEXOL 300 MG/ML  SOLN COMPARISON:  October 18, 2019 FINDINGS: Lower chest: The visualized heart size within normal limits. No pericardial fluid/thickening. No hiatal hernia. The visualized portions of the lungs are clear. Hepatobiliary: The liver is normal in density without focal abnormality.The main portal vein is patent. No evidence of calcified gallstones, gallbladder wall thickening or biliary dilatation. Pancreas: Unremarkable. No pancreatic ductal dilatation or surrounding inflammatory changes. Spleen: Normal in size without focal abnormality. Adrenals/Urinary Tract: Both adrenal glands appear normal. There is a 1 cm low-density lesion seen in the upper pole of the left kidney. The right kidney is unremarkable. Bladder is unremarkable. Stomach/Bowel: The stomach, small bowel, and colon are normal in appearance. No inflammatory changes, wall thickening, or obstructive findings.The appendix is normal. Vascular/Lymphatic: There are no enlarged mesenteric, retroperitoneal, or pelvic lymph nodes. No significant vascular findings are present. Reproductive: A small peripherally enhancing probable uterine fibroid seen in the posterior right fundus. Other: A fat containing anterior umbilical hernia is present. Musculoskeletal: No acute or significant osseous findings. IMPRESSION: No acute intra-abdominal or pelvic pathology to explain the patient's symptoms Uterine fibroid Electronically Signed   By: Jonna Clark M.D.   On: 08/04/2020 16:51   DG Hand Complete Right  Result Date: 07/08/2020 CLINICAL DATA:  Right base of thumb  pain.  No known injury. EXAM: RIGHT HAND - COMPLETE 3+ VIEW COMPARISON:  None. FINDINGS: No acute fracture or dislocation. Normal bony mineralization. Joint spaces are maintained. No bony erosion or periostitis. No evidence of other focal bone abnormality. No abnormal soft tissue mineralization. No focal soft tissue swelling. IMPRESSION: Negative. Electronically Signed   By: Duanne Guess D.O.   On: 07/08/2020 13:13      Subjective: Patient seen and examined at bedside, resting comfortably and sleeping but easily arousable.  Patient adamant about that her illicit drug use with THC has nothing to do with her presenting symptoms of nausea and vomiting.  She states "I have been smoking weed for many years".  She states "no one wants to address my abdomen that is poisoning me".  Discussed with her extensively that she had a thorough work-up to include multiple labs, imaging studies with CT abdomen/pelvis that were unremarkable.  Patient wishes to discharge home.  Discussed with her once again these abstinence from [facility].  Discharge Exam: Vitals:   08/05/20 0057 08/05/20 0525  BP: 109/71 110/76  Pulse: 68 (!) 54  Resp: 16 17  Temp: 98 F (36.7 C) 97.9 F (36.6 C)  SpO2: 100% 100%   Vitals:   08/04/20 2250 08/04/20 2339 08/05/20 0057 08/05/20 0525  BP:  127/70 109/71 110/76  Pulse:  79 68 (!) 54  Resp:  17 16 17   Temp:  99 F (37.2 C) 98 F (36.7 C) 97.9 F (36.6 C)  TempSrc:  Oral Axillary Oral  SpO2:  99% 100% 100%  Weight: 58 kg     Height: 5' (1.524 m)  General: Pt is alert, awake, not in acute distress Cardiovascular: RRR, S1/S2 +, no rubs, no gallops Respiratory: CTA bilaterally, no wheezing, no rhonchi Abdominal: Soft, NT, ND, bowel sounds + Extremities: no edema, no cyanosis    The results of significant diagnostics from this hospitalization (including imaging, microbiology, ancillary and laboratory) are listed below for reference.     Microbiology: Recent  Results (from the past 240 hour(s))  Resp Panel by RT-PCR (Flu A&B, Covid) Nasopharyngeal Swab     Status: None   Collection Time: 08/04/20  4:00 PM   Specimen: Nasopharyngeal Swab; Nasopharyngeal(NP) swabs in vial transport medium  Result Value Ref Range Status   SARS Coronavirus 2 by RT PCR NEGATIVE NEGATIVE Final    Comment: (NOTE) SARS-CoV-2 target nucleic acids are NOT DETECTED.  The SARS-CoV-2 RNA is generally detectable in upper respiratory specimens during the acute phase of infection. The lowest concentration of SARS-CoV-2 viral copies this assay can detect is 138 copies/mL. A negative result does not preclude SARS-Cov-2 infection and should not be used as the sole basis for treatment or other patient management decisions. A negative result may occur with  improper specimen collection/handling, submission of specimen other than nasopharyngeal swab, presence of viral mutation(s) within the areas targeted by this assay, and inadequate number of viral copies(<138 copies/mL). A negative result must be combined with clinical observations, patient history, and epidemiological information. The expected result is Negative.  Fact Sheet for Patients:  BloggerCourse.com  Fact Sheet for Healthcare Providers:  SeriousBroker.it  This test is no t yet approved or cleared by the [place] FDA and  has been authorized for detection and/or diagnosis of SARS-CoV-2 by FDA under an Emergency Use Authorization (EUA). This EUA will remain  in effect (meaning this test can be used) for the duration of the COVID-19 declaration under Section 564(b)(1) of the Act, 21 U.S.C.section 360bbb-3(b)(1), unless the authorization is terminated  or revoked sooner.       Influenza A by PCR NEGATIVE NEGATIVE Final   Influenza B by PCR NEGATIVE NEGATIVE Final    Comment: (NOTE) The Xpert Xpress SARS-CoV-2/FLU/RSV plus assay is intended as an aid in the  diagnosis of influenza from Nasopharyngeal swab specimens and should not be used as a sole basis for treatment. Nasal washings and aspirates are unacceptable for Xpert Xpress SARS-CoV-2/FLU/RSV testing.  Fact Sheet for Patients: BloggerCourse.com  Fact Sheet for Healthcare Providers: SeriousBroker.it  This test is not yet approved or cleared by the [place] FDA and has been authorized for detection and/or diagnosis of SARS-CoV-2 by FDA under an Emergency Use Authorization (EUA). This EUA will remain in effect (meaning this test can be used) for the duration of the COVID-19 declaration under Section 564(b)(1) of the Act, 21 U.S.C. section 360bbb-3(b)(1), unless the authorization is terminated or revoked.  Performed at [facility], [address] [place], [place] [address]      Labs: BNP (last 3 results) No results for input(s): BNP in the last 8760 hours. Basic Metabolic Panel: Recent Labs  Lab 08/04/20 1552 08/04/20 2335 08/05/20 0238  NA 138 141 139  K 3.0* 3.0* 3.2*  CL 107 112* 111  CO2 19* 17* 19*  GLUCOSE 123* 102* 96  BUN 7 <5* 5*  CREATININE 0.74 0.68 0.70  CALCIUM 9.3 8.4* 7.5*  MG  --  1.8  --    Liver Function Tests: Recent Labs  Lab 08/04/20 1552  AST 26  ALT 20  ALKPHOS 48  BILITOT 0.8  PROT 7.8  ALBUMIN 4.5   Recent Labs  Lab 08/04/20 1552  LIPASE 31   No results for input(s): AMMONIA in the last 168 hours. CBC: Recent Labs  Lab 08/04/20 1552  WBC 8.5  NEUTROABS 7.0  HGB 12.7  HCT 36.6  MCV 86.7  PLT 325   Cardiac Enzymes: No results for input(s): CKTOTAL, CKMB, CKMBINDEX, TROPONINI in the last 168 hours. BNP: Invalid input(s): POCBNP CBG: No results for input(s): GLUCAP in the last 168 hours. D-Dimer No results for input(s): DDIMER in the last 72 hours. Hgb A1c No results for input(s): HGBA1C in the last 72 hours. Lipid Profile No results for input(s):  CHOL, HDL, LDLCALC, TRIG, CHOLHDL, LDLDIRECT in the last 72 hours. Thyroid function studies No results for input(s): TSH, T4TOTAL, T3FREE, THYROIDAB in the last 72 hours.  Invalid input(s): FREET3 Anemia work up No results for input(s): VITAMINB12, FOLATE, FERRITIN, TIBC, IRON, RETICCTPCT in the last 72 hours. Urinalysis    Component Value Date/Time   COLORURINE YELLOW 08/04/2020 1536   APPEARANCEUR CLEAR 08/04/2020 1536   LABSPEC 1.025 08/04/2020 1536   PHURINE 6.0 08/04/2020 1536   GLUCOSEU NEGATIVE 08/04/2020 1536   HGBUR LARGE (A) 08/04/2020 1536   BILIRUBINUR SMALL (A) 08/04/2020 1536   KETONESUR >80 (A) 08/04/2020 1536   PROTEINUR NEGATIVE 08/04/2020 1536   NITRITE NEGATIVE 08/04/2020 1536   LEUKOCYTESUR NEGATIVE 08/04/2020 1536   Sepsis Labs Invalid input(s): PROCALCITONIN,  WBC,  LACTICIDVEN Microbiology Recent Results (from the past 240 hour(s))  Resp Panel by RT-PCR (Flu A&B, Covid) Nasopharyngeal Swab     Status: None   Collection Time: 08/04/20  4:00 PM   Specimen: Nasopharyngeal Swab; Nasopharyngeal(NP) swabs in vial transport medium  Result Value Ref Range Status   SARS Coronavirus 2 by RT PCR NEGATIVE NEGATIVE Final    Comment: (NOTE) SARS-CoV-2 target nucleic acids are NOT DETECTED.  The SARS-CoV-2 RNA is generally detectable in upper respiratory specimens during the acute phase of infection. The lowest concentration of SARS-CoV-2 viral copies this assay can detect is 138 copies/mL. A negative result does not preclude SARS-Cov-2 infection and should not be used as the sole basis for treatment or other patient management decisions. A negative result may occur with  improper specimen collection/handling, submission of specimen other than nasopharyngeal swab, presence of viral mutation(s) within the areas targeted by this assay, and inadequate number of viral copies(<138 copies/mL). A negative result must be combined with clinical observations, patient  history, and epidemiological information. The expected result is Negative.  Fact Sheet for Patients:  BloggerCourse.com  Fact Sheet for Healthcare Providers:  SeriousBroker.it  This test is no t yet approved or cleared by the [place] FDA and  has been authorized for detection and/or diagnosis of SARS-CoV-2 by FDA under an Emergency Use Authorization (EUA). This EUA will remain  in effect (meaning this test can be used) for the duration of the COVID-19 declaration under Section 564(b)(1) of the Act, 21 U.S.C.section 360bbb-3(b)(1), unless the authorization is terminated  or revoked sooner.       Influenza A by PCR NEGATIVE NEGATIVE Final   Influenza B by PCR NEGATIVE NEGATIVE Final    Comment: (NOTE) The Xpert Xpress SARS-CoV-2/FLU/RSV plus assay is intended as an aid in the diagnosis of influenza from Nasopharyngeal swab specimens and should not be used as a sole basis for treatment. Nasal washings and aspirates are unacceptable for Xpert Xpress SARS-CoV-2/FLU/RSV testing.  Fact Sheet for Patients: BloggerCourse.com  Fact  Sheet for Healthcare Providers: SeriousBroker.it  This test is not yet approved or cleared by the [place] and has been authorized for detection and/or diagnosis of SARS-CoV-2 by FDA under an Emergency Use Authorization (EUA). This EUA will remain in effect (meaning this test can be used) for the duration of the COVID-19 declaration under Section 564(b)(1) of the Act, 21 U.S.C. section 360bbb-3(b)(1), unless the authorization is terminated or revoked.  Performed at [facility], [address] [place], [place] [address]      Time coordinating discharge: Over 30 minutes  SIGNED:   Alvira Philips [place], DO  [facility] 08/05/2020, 1:17 PM

## 2020-08-05 NOTE — Progress Notes (Signed)
PROGRESS NOTE    Mary Gripbresha Flath  MVH:[id number] DOB: [birth year] DOA: 08/04/2020 PCP: Oneita HurtPcp, No    Brief Narrative:  Mary Peters is a 30 year old female with past medical history of chronic marijuana abuse who presented to the [facility] ED on 08/04/2020 with complaints of abdominal pain, nausea and vomiting.  Patient reports onset 3 days ago with severe left-sided upper and lower quadrant abdominal pain associate with nausea and vomiting.  Is unable to keep any solids or liquids down.  Patient endorses marijuana abuse, but states none in the last 3 weeks.  She is requesting pain medication.  In the ED at [facility], vital signs stable, afebrile.  WBC 8.5, hemoglobin 12.7, platelet count 325.  Sodium 138, potassium 3.0, chloride 107, bicarb 19, anion gap 12, BUN 7, creatinine 0.7, glucose 123.  LFTs within normal limits.  Lipase normal.  Urinalysis without signs of infection.  Urinary pregnancy test negative.  UDS positive for THC.  SARS-CoV-2 PCR test negative.  Influenza panel negative.  CT abdomen/pelvis negative for acute intra-abdominal or pelvic pathology to explain patient's symptoms.  Patient was given fentanyl, Toradol, Phenergan, IV potassium and 2 L NS bolus.  Hospital service consulted for further evaluation and management of intractable nausea/vomiting.   Assessment & Plan:   Principal Problem:   Nausea and vomiting Active Problems:   Abdominal pain   Hypokalemia   Metabolic acidosis   Intractable nausea/vomiting secondary to cannabinoid hyperemesis syndrome Abdominal pain Lactic acidosis: Resolved Patient presenting to the ED with intractable nausea/vomiting with associated left-sided upper/lower quadrant abdominal pain.  Patient is afebrile without leukocytosis.  LFTs within normal limits.  Urinary pregnancy test negative.  Lipase within normal limits.  CT abdomen/pelvis negative for acute intra-abdominal or pelvic pathology to explain patient's symptoms;  although she has a fat-containing umbilical hernia with no bowel obstruction which would not cause her symptoms.  UDS is positive, and on review of EMR she remains positive on all UDS for THC.  Also patient has had multiple ED visits over the past year, 12 within the [facility] system as well as 7 in [facility].  Suspect etiology of her symptoms related to her persistent marijuana abuse. --Lactic acid 2.1>0.6 --Clear liquid diet, advance as tolerates --IVF hydration with NS at 125 mL's per hour --Phenergan as needed nausea --Compazine IV as needed for nausea/vomiting not controlled with Phenergan --THC cessation  Hypokalemia Potassium 3.2, will replete.  Follow electrolytes daily including magnesium  Metabolic acidosis A bicarbonate level of 19, likely secondary to intractable nausea/vomiting as above.  Supportive care, IV fluid hydration.   DVT prophylaxis: Lovenox   Code Status: Full Code Family Communication: No family present at bedside  Disposition Plan:  Level of care: Med-Surg Status is: Observation  The patient remains OBS appropriate and will d/c before 2 midnights.  Dispo: The patient is from: Home              Anticipated d/c is to: Home              Patient currently is not medically stable to d/c.   Difficult to place patient No  Consultants:   None  Procedures:   None  Antimicrobials:   None   Subjective: Patient seen and examined at bedside, resting comfortably.  Sleeping but easily arousable.  Continues to report nausea and vomiting with associated bowel pain.  Discussed with patient that this is all related to likely her continue THC abuse.  She remains afebrile without leukocytosis and no concerning findings on imaging studies.  On review of her EMR, she has multiple ED visits over the past year, 12 within the [place] system and 7 within the [facility] system which is highly concerning given her young age and  motivations.  Objective: Vitals:   08/04/20 2250 08/04/20 2339 08/05/20 0057 08/05/20 0525  BP:  127/70 109/71 110/76  Pulse:  79 68 (!) 54  Resp:  17 16 17   Temp:  99 F (37.2 C) 98 F (36.7 C) 97.9 F (36.6 C)  TempSrc:  Oral Axillary Oral  SpO2:  99% 100% 100%  Weight: 58 kg     Height: 5' (1.524 m)       Intake/Output Summary (Last 24 hours) at 08/05/2020 1115 Last data filed at 08/05/2020 1058 Gross per 24 hour  Intake 976.83 ml  Output 203 ml  Net 773.83 ml   Filed Weights   08/04/20 2250  Weight: 58 kg    Examination:  General exam: Appears calm and comfortable  Respiratory system: Clear to auscultation. Respiratory effort normal. Cardiovascular system: S1 & S2 heard, RRR. No JVD, murmurs, rubs, gallops or clicks. No pedal edema. Gastrointestinal system: Abdomen is nondistended, soft and nontender. No organomegaly or masses felt. Normal bowel sounds heard. Central nervous system: Alert and oriented. No focal neurological deficits. Extremities: Symmetric 5 x 5 power. Skin: No rashes, lesions or ulcers Psychiatry: Judgement and insight appear normal. Mood & affect appropriate.     Data Reviewed: I have personally reviewed following labs and imaging studies  CBC: Recent Labs  Lab 08/04/20 1552  WBC 8.5  NEUTROABS 7.0  HGB 12.7  HCT 36.6  MCV 86.7  PLT 325   Basic Metabolic Panel: Recent Labs  Lab 08/04/20 1552 08/04/20 2335 08/05/20 0238  NA 138 141 139  K 3.0* 3.0* 3.2*  CL 107 112* 111  CO2 19* 17* 19*  GLUCOSE 123* 102* 96  BUN 7 <5* 5*  CREATININE 0.74 0.68 0.70  CALCIUM 9.3 8.4* 7.5*  MG  --  1.8  --    GFR: Estimated Creatinine Clearance: 82.7 mL/min (by C-G formula based on SCr of 0.7 mg/dL). Liver Function Tests: Recent Labs  Lab 08/04/20 1552  AST 26  ALT 20  ALKPHOS 48  BILITOT 0.8  PROT 7.8  ALBUMIN 4.5   Recent Labs  Lab 08/04/20 1552  LIPASE 31   No results for input(s): AMMONIA in the last 168  hours. Coagulation Profile: No results for input(s): INR, PROTIME in the last 168 hours. Cardiac Enzymes: No results for input(s): CKTOTAL, CKMB, CKMBINDEX, TROPONINI in the last 168 hours. BNP (last 3 results) No results for input(s): PROBNP in the last 8760 hours. HbA1C: No results for input(s): HGBA1C in the last 72 hours. CBG: No results for input(s): GLUCAP in the last 168 hours. Lipid Profile: No results for input(s): CHOL, HDL, LDLCALC, TRIG, CHOLHDL, LDLDIRECT in the last 72 hours. Thyroid Function Tests: No results for input(s): TSH, T4TOTAL, FREET4, T3FREE, THYROIDAB in the last 72 hours. Anemia Panel: No results for input(s): VITAMINB12, FOLATE, FERRITIN, TIBC, IRON, RETICCTPCT in the last 72 hours. Sepsis Labs: Recent Labs  Lab 08/04/20 2335 08/05/20 0238  LATICACIDVEN 2.1* 0.6    Recent Results (from the past 240 hour(s))  Resp Panel by RT-PCR (Flu A&B, Covid) Nasopharyngeal Swab     Status: None   Collection Time: 08/04/20  4:00 PM   Specimen: Nasopharyngeal Swab; Nasopharyngeal(NP) swabs in  vial transport medium  Result Value Ref Range Status   SARS Coronavirus 2 by RT PCR NEGATIVE NEGATIVE Final    Comment: (NOTE) SARS-CoV-2 target nucleic acids are NOT DETECTED.  The SARS-CoV-2 RNA is generally detectable in upper respiratory specimens during the acute phase of infection. The lowest concentration of SARS-CoV-2 viral copies this assay can detect is 138 copies/mL. A negative result does not preclude SARS-Cov-2 infection and should not be used as the sole basis for treatment or other patient management decisions. A negative result may occur with  improper specimen collection/handling, submission of specimen other than nasopharyngeal swab, presence of viral mutation(s) within the areas targeted by this assay, and inadequate number of viral copies(<138 copies/mL). A negative result must be combined with clinical observations, patient history, and  epidemiological information. The expected result is Negative.  Fact Sheet for Patients:  BloggerCourse.com  Fact Sheet for Healthcare Providers:  SeriousBroker.it  This test is no t yet approved or cleared by the [place] FDA and  has been authorized for detection and/or diagnosis of SARS-CoV-2 by FDA under an Emergency Use Authorization (EUA). This EUA will remain  in effect (meaning this test can be used) for the duration of the COVID-19 declaration under Section 564(b)(1) of the Act, 21 U.S.C.section 360bbb-3(b)(1), unless the authorization is terminated  or revoked sooner.       Influenza A by PCR NEGATIVE NEGATIVE Final   Influenza B by PCR NEGATIVE NEGATIVE Final    Comment: (NOTE) The Xpert Xpress SARS-CoV-2/FLU/RSV plus assay is intended as an aid in the diagnosis of influenza from Nasopharyngeal swab specimens and should not be used as a sole basis for treatment. Nasal washings and aspirates are unacceptable for Xpert Xpress SARS-CoV-2/FLU/RSV testing.  Fact Sheet for Patients: BloggerCourse.com  Fact Sheet for Healthcare Providers: SeriousBroker.it  This test is not yet approved or cleared by the [place] FDA and has been authorized for detection and/or diagnosis of SARS-CoV-2 by FDA under an Emergency Use Authorization (EUA). This EUA will remain in effect (meaning this test can be used) for the duration of the COVID-19 declaration under Section 564(b)(1) of the Act, 21 U.S.C. section 360bbb-3(b)(1), unless the authorization is terminated or revoked.  Performed at [facility], [address] [place], [place] [address]          Radiology Studies: CT Abdomen Pelvis W Contrast  Result Date: 08/04/2020 CLINICAL DATA:  Abdominal pain EXAM: CT ABDOMEN AND PELVIS WITH CONTRAST TECHNIQUE: Multidetector CT imaging of the abdomen and pelvis  was performed using the standard protocol following bolus administration of intravenous contrast. CONTRAST:  OMNIPAQUE IOHEXOL 300 MG/ML  SOLN COMPARISON:  October 18, 2019 FINDINGS: Lower chest: The visualized heart size within normal limits. No pericardial fluid/thickening. No hiatal hernia. The visualized portions of the lungs are clear. Hepatobiliary: The liver is normal in density without focal abnormality.The main portal vein is patent. No evidence of calcified gallstones, gallbladder wall thickening or biliary dilatation. Pancreas: Unremarkable. No pancreatic ductal dilatation or surrounding inflammatory changes. Spleen: Normal in size without focal abnormality. Adrenals/Urinary Tract: Both adrenal glands appear normal. There is a 1 cm low-density lesion seen in the upper pole of the left kidney. The right kidney is unremarkable. Bladder is unremarkable. Stomach/Bowel: The stomach, small bowel, and colon are normal in appearance. No inflammatory changes, wall thickening, or obstructive findings.The appendix is normal. Vascular/Lymphatic: There are no enlarged mesenteric, retroperitoneal, or pelvic lymph nodes. No significant vascular findings are present. Reproductive:  A small peripherally enhancing probable uterine fibroid seen in the posterior right fundus. Other: A fat containing anterior umbilical hernia is present. Musculoskeletal: No acute or significant osseous findings. IMPRESSION: No acute intra-abdominal or pelvic pathology to explain the patient's symptoms Uterine fibroid Electronically Signed   By: Jonna Clark M.D.   On: 08/04/2020 16:51        Scheduled Meds: . enoxaparin (LOVENOX) injection  40 mg Subcutaneous Q24H  . pantoprazole (PROTONIX) IV  40 mg Intravenous Q24H   Continuous Infusions: . sodium chloride Stopped (08/04/20 1733)  . sodium chloride 125 mL/hr at 08/04/20 2349  . potassium chloride 10 mEq (08/05/20 1055)  . promethazine (PHENERGAN) injection 25 mg (08/05/20  0738)     LOS: 0 days    Time spent: 36 minutes spent on chart review, discussion with nursing staff, consultants, updating family and interview/physical exam; more than 50% of that time was spent in counseling and/or coordination of care.    Alvira Philips [place], DO [facility] Available via Epic secure chat 7am-7pm After these hours, please refer to coverage provider listed on [email or web address] 08/05/2020, 11:15 AM

## 2020-08-08 ENCOUNTER — Emergency Department (HOSPITAL_BASED_OUTPATIENT_CLINIC_OR_DEPARTMENT_OTHER)
Admission: EM | Admit: 2020-08-08 | Discharge: 2020-08-08 | Disposition: A | Discharge status: Home / Self Care | Payer: Self-pay | Attending: Emergency Medicine | Admitting: Emergency Medicine

## 2020-08-08 ENCOUNTER — Encounter (HOSPITAL_BASED_OUTPATIENT_CLINIC_OR_DEPARTMENT_OTHER): Payer: Self-pay | Admitting: Emergency Medicine

## 2020-08-08 ENCOUNTER — Other Ambulatory Visit: Payer: Self-pay

## 2020-08-08 DIAGNOSIS — L0291 Cutaneous abscess, unspecified: Secondary | ICD-10-CM

## 2020-08-08 DIAGNOSIS — L0231 Cutaneous abscess of buttock: Secondary | ICD-10-CM | POA: Insufficient documentation

## 2020-08-08 MED ORDER — LIDOCAINE HCL (PF) 1 % IJ SOLN
5.0000 mL | Freq: Once | INTRAMUSCULAR | Status: AC
  Administered 2020-08-08: 5 mL
  Filled 2020-08-08: qty 5

## 2020-08-08 MED ORDER — LIDOCAINE-EPINEPHRINE-TETRACAINE (LET) TOPICAL GEL
3.0000 mL | Freq: Once | TOPICAL | Status: AC
  Administered 2020-08-08: 3 mL via TOPICAL
  Filled 2020-08-08: qty 3

## 2020-08-08 NOTE — ED Notes (Signed)
ED Provider at bedside. 

## 2020-08-08 NOTE — Discharge Instructions (Addendum)
I suspect you have a abscess versus a cyst, I want you to apply warm compresses to the area 3 times daily, and change out the dressings twice a day.  You may take over-the-counter pain medication like ibuprofen and or Tylenol every 6 hours as needed please follow dosing on the back of bottle.  I want you to follow-up at an urgent care, this department, your PCP in 3 days for a wound check.  Come back to the emergency department if you develop chest pain, shortness of breath, severe abdominal pain, uncontrolled nausea, vomiting, diarrhea.

## 2020-08-08 NOTE — ED Notes (Signed)
In room with PA, performing assessment of patients genital area for complaints of swollen/painful site. Melodye Ped

## 2020-08-08 NOTE — ED Notes (Signed)
RN Melodye Ped at bedside with patient while PA I/D area around perineal area. Cleansed and placed dressing per PA orders. Melodye Ped

## 2020-08-08 NOTE — ED Provider Notes (Signed)
[facility] EMERGENCY DEPARTMENT Provider Note   CSN: [id number] Arrival date & time: 08/08/20  1609     History Chief Complaint  Patient presents with  . Abscess    Mary Peters is a 30 y.o. female.  HPI   Patient with no significant medical history presents to the emergency department with chief complaint of boil on her left buttocks.  She endorses she noticed this a couple days ago and it has increased in size.  She endorses she now has a bump on her left butt cheek, states it is tender to palpation, denies  drainage or discharge from it, has no rectal pain, no difficult bowel movements, has no systemic infection like fevers or chills, she is not immunocompromise, fully update on her tetanus shot.  States she has had these in the past and generally they go away on their own.  Patient denies any alleviating factors.  Patient denies headaches, fevers, chills, shortness of breath, chest pain, abdominal pain, nausea, vomiting, diarrhea, worsening pedal edema.  History reviewed. No pertinent past medical history.  Patient Active Problem List   Diagnosis Date Noted  . Nausea and vomiting 08/04/2020  . Abdominal pain 08/04/2020  . Hypokalemia 08/04/2020  . Metabolic acidosis 08/04/2020  . 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 Cervical paraspinal muscle spasm 10/25/2016    History reviewed. No pertinent surgical history.   OB History   No obstetric history on file.     Family History  Problem Relation Age of Onset  . Diabetes Mother   . COPD Maternal Grandmother   . Stomach cancer Maternal Grandfather     Social History   Tobacco Use  . Smoking status: Never Smoker  . Smokeless tobacco: Never Used  Vaping Use  . Vaping Use: Never used  Substance Use Topics  . Alcohol use: No  . Drug use: Yes    Types: Marijuana    Home Medications Prior to Admission medications   Medication Sig  Start Date End Date Taking? Authorizing Provider  promethazine (PHENERGAN) 12.5 MG tablet Take 2 tablets (25 mg total) by mouth every 8 (eight) hours as needed for nausea or vomiting. 09/06/17 09/13/19  Maczis, Elmer Sow, PA-C    Allergies    Haloperidol  Review of Systems   Review of Systems  Constitutional: Negative for chills and fever.  HENT: Negative for congestion.   Respiratory: Negative for shortness of breath.   Cardiovascular: Negative for chest pain.  Gastrointestinal: Negative for abdominal pain, diarrhea, nausea and vomiting.  Genitourinary: Negative for enuresis.  Musculoskeletal: Negative for back pain.  Skin: Negative for rash.       Boil on left buttocks  Neurological: Negative for dizziness.  Hematological: Does not bruise/bleed easily.    Physical Exam Updated Vital Signs BP 133/85   Pulse 80   Temp 98.8 F (37.1 C) (Oral)   Resp 20   Ht 5' (1.524 m)   Wt 59.4 kg   LMP 08/01/2020   SpO2 100%   BMI 25.58 kg/m   Physical Exam Vitals and nursing note reviewed. Exam conducted with a chaperone present.  Constitutional:      General: She is not in acute distress.    Appearance: Normal appearance. She is not ill-appearing or diaphoretic.  HENT:     Head: Normocephalic and atraumatic.     Nose: No congestion or rhinorrhea.  Eyes:     Conjunctiva/sclera: Conjunctivae normal.  Cardiovascular:  Rate and Rhythm: Normal rate and regular rhythm.  Pulmonary:     Effort: Pulmonary effort is normal.  Genitourinary:    Comments: With a chaperone present, patient has a noted papule at the gluteal fold of the left buttocks, there is no surrounding erythema or edema, no discharge or drainage present.  Area was not warm to the touch, slightly tender to palpation, fluctuance and indurations present. Musculoskeletal:     Cervical back: Neck supple.     Right lower leg: No edema.     Left lower leg: No edema.  Skin:    General: Skin is warm and dry.     Coloration:  Skin is not jaundiced or pale.  Neurological:     Mental Status: She is alert and oriented to person, place, and time.  Psychiatric:        Mood and Affect: Mood normal.     ED Results / Procedures / Treatments   Labs (all labs ordered are listed, but only abnormal results are displayed) Labs Reviewed - No data to display  EKG None  Radiology No results found.  Procedures .Marland KitchenIncision and Drainage  Date/Time: 08/08/2020 5:48 PM Performed by: Carroll Sage, PA-C Authorized by: Carroll Sage, PA-C   Consent:    Consent obtained:  Verbal   Consent given by:  Patient   Risks discussed:  Bleeding, incomplete drainage, pain, damage to other organs and infection   Alternatives discussed:  No treatment Universal protocol:    Patient identity confirmed:  Verbally with patient Location:    Type:  Abscess   Size:  2cm   Location:  Lower extremity   Lower extremity location:  Buttock   Buttock location:  L buttock Pre-procedure details:    Skin preparation:  Antiseptic wash Sedation:    Sedation type:  None Anesthesia:    Anesthesia method:  Topical application and local infiltration   Topical anesthetic:  LET   Local anesthetic:  Lidocaine 1% w/o epi Procedure type:    Complexity:  Simple Procedure details:    Ultrasound guidance: no     Needle aspiration: no     Incision types:  Single straight   Incision depth:  Dermal   Wound management:  Probed and deloculated   Drainage:  Bloody   Drainage amount:  Scant   Wound treatment:  Wound left open   Packing materials:  None Post-procedure details:    Procedure completion:  Tolerated well, no immediate complications     Medications Ordered in ED Medications  lidocaine (PF) (XYLOCAINE) 1 % injection 5 mL (5 mLs Infiltration Given by Other 08/08/20 1649)  lidocaine-EPINEPHrine-tetracaine (LET) topical gel (3 mLs Topical Given 08/08/20 1650)    ED Course  I have reviewed the triage vital signs and the nursing  notes.  Pertinent labs & imaging results that were available during my care of the patient were reviewed by me and considered in my medical decision making (see chart for details).    MDM Rules/Calculators/A&P                         Initial impression-patient presents with boil on her left buttocks, she is alert, does not appear in acute distress, vital signs reassuring.  Will recommend I&D I suspect this is a abscess.  Patient is agreeable to this, will apply let to anesthetize the area and proceed with I&D.   Reassessment Patient with skin abscess amenable to incision and drainage.  Patient tolerated procedure well.  There was no noted purulent discharge noted, abscess was not large enough to warrant packing or drain, no signs of cellulitis surrounding skin.   Rule out-low suspicion for overlying cellulitis as there is no noted erythema or edema present on my exam.  Low suspicion for systemic infection as patient is nontoxic-appearing, vital signs reassuring no obvious infection on my exam.  Low suspicion for perirectal abscess or fistula as patient denies rectal pain, no difficult bowel movements.  Plan-ID was successfully performed suspect she may have a cyst versus abscess as there is no purulent discharge noted on exam.  Will recommend warm compress to the area, have her follow-up in 3 days time for wound check.  Will defer antibiotic treatment as patient is not immunocompromise no overlying cellulitis present.  Vital signs have remained stable, no indication for hospital admission.   Patient given at home care as well strict return precautions.  Patient verbalized that they understood agreed to said plan.   Final Clinical Impression(s) / ED Diagnoses Final diagnoses:  Abscess    Rx / DC Orders ED Discharge Orders    None       Carroll Sage, PA-C 08/08/20 1754    Arby Barrette, MD 08/18/20 0710

## 2020-08-08 NOTE — ED Notes (Signed)
Pt dc home with bandages for dressing change per PA. Pt understands dc orders and to return in 3 days for wound check.Mary Peters

## 2020-08-08 NOTE — ED Triage Notes (Signed)
Pt presents to ED POV. Pt c/o abscess on bottom. Pt reports that she felt warm at home, purulent drainage, and foul smell coming from wound. Pt ambulating independently

## 2020-08-08 NOTE — ED Notes (Signed)
Administered LET to patients affected area. Mary Peters

## 2020-11-15 ENCOUNTER — Emergency Department (HOSPITAL_BASED_OUTPATIENT_CLINIC_OR_DEPARTMENT_OTHER): Payer: Self-pay

## 2020-11-15 ENCOUNTER — Emergency Department (HOSPITAL_BASED_OUTPATIENT_CLINIC_OR_DEPARTMENT_OTHER)
Admission: EM | Admit: 2020-11-15 | Discharge: 2020-11-15 | Disposition: A | Discharge status: Home / Self Care | Payer: Self-pay | Attending: Emergency Medicine | Admitting: Emergency Medicine

## 2020-11-15 ENCOUNTER — Other Ambulatory Visit: Payer: Self-pay

## 2020-11-15 ENCOUNTER — Encounter (HOSPITAL_BASED_OUTPATIENT_CLINIC_OR_DEPARTMENT_OTHER): Payer: Self-pay | Admitting: Emergency Medicine

## 2020-11-15 DIAGNOSIS — E876 Hypokalemia: Secondary | ICD-10-CM | POA: Insufficient documentation

## 2020-11-15 DIAGNOSIS — E872 Acidosis: Secondary | ICD-10-CM | POA: Insufficient documentation

## 2020-11-15 DIAGNOSIS — R1084 Generalized abdominal pain: Secondary | ICD-10-CM

## 2020-11-15 DIAGNOSIS — R112 Nausea with vomiting, unspecified: Secondary | ICD-10-CM

## 2020-11-15 LAB — URINALYSIS, ROUTINE W REFLEX MICROSCOPIC
Bilirubin Urine: NEGATIVE
Glucose, UA: NEGATIVE mg/dL
Ketones, ur: NEGATIVE mg/dL
Leukocytes,Ua: NEGATIVE
Nitrite: NEGATIVE
Protein, ur: NEGATIVE mg/dL
Specific Gravity, Urine: <1.005 — ABNORMAL LOW (ref 1.005–1.030)
pH: 7 (ref 5.0–8.0)

## 2020-11-15 LAB — CBC WITH DIFFERENTIAL/PLATELET
Abs Immature Granulocytes: 0.03 10*3/uL (ref 0.00–0.07)
Basophils Absolute: 0 10*3/uL (ref 0.0–0.1)
Basophils Relative: 0 %
Eosinophils Absolute: 0.2 10*3/uL (ref 0.0–0.5)
Eosinophils Relative: 3 %
HCT: 40.6 % (ref 36.0–46.0)
Hemoglobin: 13.5 g/dL (ref 12.0–15.0)
Immature Granulocytes: 0 %
Lymphocytes Relative: 58 %
Lymphs Abs: 4.2 10*3/uL — ABNORMAL HIGH (ref 0.7–4.0)
MCH: 29.7 pg (ref 26.0–34.0)
MCHC: 33.3 g/dL (ref 30.0–36.0)
MCV: 89.4 fL (ref 80.0–100.0)
Monocytes Absolute: 0.6 10*3/uL (ref 0.1–1.0)
Monocytes Relative: 8 %
Neutro Abs: 2.3 10*3/uL (ref 1.7–7.7)
Neutrophils Relative %: 31 %
Platelets: 320 10*3/uL (ref 150–400)
RBC: 4.54 MIL/uL (ref 3.87–5.11)
RDW: 13.2 % (ref 11.5–15.5)
WBC: 7.4 10*3/uL (ref 4.0–10.5)
nRBC: 0 % (ref 0.0–0.2)

## 2020-11-15 LAB — COMPREHENSIVE METABOLIC PANEL
ALT: 17 U/L (ref 0–44)
AST: 20 U/L (ref 15–41)
Albumin: 4.1 g/dL (ref 3.5–5.0)
Alkaline Phosphatase: 47 U/L (ref 38–126)
Anion gap: 9 (ref 5–15)
BUN: 10 mg/dL (ref 6–20)
CO2: 23 mmol/L (ref 22–32)
Calcium: 9.1 mg/dL (ref 8.9–10.3)
Chloride: 105 mmol/L (ref 98–111)
Creatinine, Ser: 0.73 mg/dL (ref 0.44–1.00)
GFR, Estimated: >60 mL/min (ref >60)
Glucose, Bld: 110 mg/dL — ABNORMAL HIGH (ref 70–99)
Potassium: 3.3 mmol/L — ABNORMAL LOW (ref 3.5–5.1)
Sodium: 137 mmol/L (ref 135–145)
Total Bilirubin: 0.3 mg/dL (ref 0.3–1.2)
Total Protein: 7.2 g/dL (ref 6.5–8.1)

## 2020-11-15 LAB — RAPID URINE DRUG SCREEN, HOSP PERFORMED
Amphetamines: NOT DETECTED
Barbiturates: NOT DETECTED
Benzodiazepines: NOT DETECTED
Cocaine: NOT DETECTED
Opiates: POSITIVE — AB
Tetrahydrocannabinol: POSITIVE — AB

## 2020-11-15 LAB — LIPASE, BLOOD: Lipase: 35 U/L (ref 11–51)

## 2020-11-15 LAB — PREGNANCY, URINE: Preg Test, Ur: NEGATIVE

## 2020-11-15 LAB — URINALYSIS, MICROSCOPIC (REFLEX)

## 2020-11-15 MED ORDER — ONDANSETRON HCL 4 MG/2ML IJ SOLN
4.0000 mg | Freq: Once | INTRAMUSCULAR | Status: AC
  Administered 2020-11-15: 4 mg via INTRAVENOUS
  Filled 2020-11-15: qty 2

## 2020-11-15 MED ORDER — KETOROLAC TROMETHAMINE 30 MG/ML IJ SOLN
30.0000 mg | Freq: Once | INTRAMUSCULAR | Status: AC
  Administered 2020-11-15: 30 mg via INTRAVENOUS
  Filled 2020-11-15: qty 1

## 2020-11-15 MED ORDER — IOHEXOL 300 MG/ML  SOLN
100.0000 mL | Freq: Once | INTRAMUSCULAR | Status: AC | PRN
  Administered 2020-11-15: 100 mL via INTRAVENOUS

## 2020-11-15 MED ORDER — PROMETHAZINE HCL 25 MG/ML IJ SOLN
INTRAMUSCULAR | Status: AC
  Filled 2020-11-15: qty 1

## 2020-11-15 MED ORDER — SODIUM CHLORIDE 0.9 % IV BOLUS
1000.0000 mL | Freq: Once | INTRAVENOUS | Status: AC
  Administered 2020-11-15: 1000 mL via INTRAVENOUS

## 2020-11-15 MED ORDER — SODIUM CHLORIDE 0.9 % IV SOLN
25.0000 mg | Freq: Once | INTRAVENOUS | Status: AC
  Administered 2020-11-15: 25 mg via INTRAVENOUS
  Filled 2020-11-15: qty 1

## 2020-11-15 MED ORDER — PROMETHAZINE HCL 25 MG PO TABS: 25.0000 mg | ORAL_TABLET | Freq: Four times a day (QID) | ORAL | 0 refills | Status: DC | PRN

## 2020-11-15 MED ORDER — MORPHINE SULFATE (PF) 4 MG/ML IV SOLN
4.0000 mg | Freq: Once | INTRAVENOUS | Status: AC
Start: 2020-11-15 — End: 2020-11-15
  Administered 2020-11-15: 4 mg via INTRAVENOUS
  Filled 2020-11-15: qty 1

## 2020-11-15 NOTE — ED Notes (Signed)
Pt reports nausea has improved. Pt ambulated to restroom unassisted to obtain urine specimen

## 2020-11-15 NOTE — ED Notes (Signed)
Pt discharged to home. Discharge instructions have been discussed with patient and/or family members. Pt verbally acknowledges understanding d/c instructions, and endorses comprehension to checkout at registration before leaving.  °

## 2020-11-15 NOTE — ED Notes (Signed)
  Asked patient to obtain urine specimen.  Patient stated she didn't have to void at this time but would let us know.

## 2020-11-15 NOTE — ED Notes (Signed)
Patient transported to CT 

## 2020-11-15 NOTE — ED Notes (Signed)
Pharmacy and medications updated with patient. Pt denies daily medications 

## 2020-11-15 NOTE — ED Triage Notes (Signed)
Reports sudden onset vomiting this morning. Pt reports seeing streaks of blood in her vomit. She states this has happened multiple times before. She reports hx of GI bleed, and umbilical hernia. Actively vomiting during triage.  Denies fever or other sx. States she "felt fine" prior to the initial vomiting this morning.

## 2020-11-15 NOTE — Discharge Instructions (Signed)
You were seen in the emergency department for nausea vomiting and abdominal pain.  You had blood work urinalysis and a CAT scan that did not show an obvious explanation for your symptoms.  You did have an umbilical hernia on your CAT scan.  It will be important for you to get a primary care doctor.  We are prescribing you some nausea medication.  Return to the emergency department for any worsening or concerning symptoms

## 2020-11-15 NOTE — ED Provider Notes (Signed)
Signout from Dr. Judd Lien.  30 year old female with recurrent abdominal pain nausea vomiting.  History of same.  Getting labs CT imaging and symptomatic treatment.  Plan is for reassessment after work-up completed.  Disposition per results of testing. Physical Exam  BP 131/86   Pulse 81   Temp 98.3 F (36.8 C) (Oral)   Resp 20   Ht 5' (1.524 m)   Wt 54.4 kg   LMP 11/01/2020   SpO2 95%   BMI 23.44 kg/m   Physical Exam  ED Course/Procedures     Procedures  MDM  Reassessment 8:30 AM.  Pain and nausea improved.  Labs with some mild hypokalemia.  Otherwise fairly unremarkable.  Still awaiting urine.  CT imaging with some periportal edema nonspecific and umbilical hernia containing fat.  Reviewed results with patient.  Will provide with prescription for some Phenergan.  Recommended marijuana abstinence.  Return instructions discussed       Terrilee Files, MD 11/15/20 1739

## 2020-11-15 NOTE — ED Notes (Signed)
Per EDP order, pt givengingerale and crackers for PO challenge. Pt verbalized understanding to utilize call bell if nausea or emesis occur.

## 2020-11-15 NOTE — ED Provider Notes (Signed)
[facility] EMERGENCY DEPARTMENT Provider Note   CSN: [id number] Arrival date & time: 11/15/20  0551     History Chief Complaint  Patient presents with   Vomiting    Carroll Ranney is a 30 y.o. female.  Patient is a 30 year old female with history of what has been felt to be cannabinoid hyperemesis.  She presents today with complaints of abdominal pain and vomiting.  This started early this morning.  She reports multiple episodes of vomiting, most recently streaked with blood.  She denies any fevers or chills.  She denies any diarrhea or constipation.  She denies any black or melanotic stools.  She has been admitted both in the [facility] and [facility] systems with similar episodes.  These have been occurring intermittently for the past 2 years.  She tells me that she smokes marijuana 2 or 3 times per week and has tried stopping, however these episodes occur anyway.  She believes that she has an umbilical hernia that is the cause.  The history is provided by the patient.      History reviewed. No pertinent past medical history.  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 No pertinent surgical history.   OB History   No obstetric history on file.     Family History  Problem Relation Age of Onset   Diabetes Mother    COPD Maternal Grandmother    Stomach cancer Maternal Grandfather     Social History   Tobacco Use   Smoking status: Never   Smokeless tobacco: Never  Vaping Use   Vaping Use: Never used  Substance Use Topics   Alcohol use: No   Drug use: Yes    Types: Marijuana    Home Medications Prior to Admission medications   Medication Sig Start Date  End Date Taking? Authorizing Provider  ondansetron (ZOFRAN-ODT) 4 MG disintegrating tablet DISSOLVE 1 TABLET BY MOUTH EVERY 8 HOURS AS NEEDED FOR NAUSEA AND/OR VOMITING 05/12/20 05/12/21  Farrel Gordon, PA-C  promethazine (PHENERGAN) 12.5 MG tablet Take 2 tablets (25 mg total) by mouth every 8 (eight) hours as needed for nausea or vomiting. 09/06/17 09/13/19  Maczis, Elmer Sow, PA-C    Allergies    Haloperidol  Review of Systems   Review of Systems  All other systems reviewed and are negative.  Physical Exam Updated Vital Signs Pulse 85   Temp 98.3 F (36.8 C) (Oral)   Resp 19   Ht 5' (1.524 m)   Wt 54.4 kg   LMP 11/01/2020   SpO2 100%   BMI 23.44 kg/m   Physical Exam Vitals and nursing note reviewed.  Constitutional:      General: She is not in acute distress.    Appearance: She is well-developed. She is not diaphoretic.  HENT:     Head: Normocephalic and atraumatic.  Cardiovascular:     Rate and Rhythm: Normal rate and regular rhythm.     Heart sounds: No murmur heard.   No friction rub. No gallop.  Pulmonary:     Effort: Pulmonary effort is normal. No respiratory distress.     Breath sounds: Normal breath sounds. No wheezing.  Abdominal:     General: Bowel sounds are  normal. There is no distension.     Palpations: Abdomen is soft.     Tenderness: There is abdominal tenderness. There is no right CVA tenderness, left CVA tenderness or rebound.     Comments: There is generalized abdominal tenderness with no rebound or guarding.  I am unable to palpate an incarcerated hernia in the umbilicus.  Musculoskeletal:        General: Normal range of motion.     Cervical back: Normal range of motion and neck supple.  Skin:    General: Skin is warm and dry.  Neurological:     General: No focal deficit present.     Mental Status: She is alert and oriented to person, place, and time.    ED Results / Procedures / Treatments   Labs (all labs ordered are listed, but only abnormal  results are displayed) Labs Reviewed - No data to display  EKG None  Radiology No results found.  Procedures Procedures   Medications Ordered in ED Medications  sodium chloride 0.9 % bolus 1,000 mL (has no administration in time range)  ondansetron (ZOFRAN) injection 4 mg (has no administration in time range)  morphine 4 MG/ML injection 4 mg (has no administration in time range)  ketorolac (TORADOL) 30 MG/ML injection 30 mg (has no administration in time range)    ED Course  I have reviewed the triage vital signs and the nursing notes.  Pertinent labs & imaging results that were available during my care of the patient were reviewed by me and considered in my medical decision making (see chart for details).    MDM Rules/Calculators/A&P  Patient with history of cannabinoid hyperemesis presenting with complaints of vomiting and abdominal pain.  Medications have been ordered, laboratory studies have also been ordered.  Care signed out to Dr. Charm Barges at shift change.  He will obtain the results of the CT scan, laboratory studies, reassess the patient, and determine the final disposition.  Final Clinical Impression(s) / ED Diagnoses Final diagnoses:  None    Rx / DC Orders ED Discharge Orders     None        Geoffery Lyons, MD 11/16/20 [phone]

## 2021-08-30 ENCOUNTER — Telehealth: Payer: Self-pay

## 2021-08-30 ENCOUNTER — Other Ambulatory Visit (HOSPITAL_BASED_OUTPATIENT_CLINIC_OR_DEPARTMENT_OTHER): Payer: Self-pay

## 2021-08-30 ENCOUNTER — Emergency Department (HOSPITAL_BASED_OUTPATIENT_CLINIC_OR_DEPARTMENT_OTHER): Payer: Self-pay

## 2021-08-30 ENCOUNTER — Emergency Department (HOSPITAL_BASED_OUTPATIENT_CLINIC_OR_DEPARTMENT_OTHER)
Admission: EM | Admit: 2021-08-30 | Discharge: 2021-08-30 | Disposition: A | Discharge status: Home / Self Care | Payer: Self-pay | Attending: Emergency Medicine | Admitting: Emergency Medicine

## 2021-08-30 ENCOUNTER — Other Ambulatory Visit: Payer: Self-pay

## 2021-08-30 ENCOUNTER — Encounter (HOSPITAL_BASED_OUTPATIENT_CLINIC_OR_DEPARTMENT_OTHER): Payer: Self-pay

## 2021-08-30 DIAGNOSIS — R1033 Periumbilical pain: Secondary | ICD-10-CM

## 2021-08-30 DIAGNOSIS — K429 Umbilical hernia without obstruction or gangrene: Secondary | ICD-10-CM | POA: Insufficient documentation

## 2021-08-30 LAB — COMPREHENSIVE METABOLIC PANEL
ALT: 21 U/L (ref 0–44)
AST: 23 U/L (ref 15–41)
Albumin: 3.8 g/dL (ref 3.5–5.0)
Alkaline Phosphatase: 50 U/L (ref 38–126)
Anion gap: 8 (ref 5–15)
BUN: 8 mg/dL (ref 6–20)
CO2: 22 mmol/L (ref 22–32)
Calcium: 9.1 mg/dL (ref 8.9–10.3)
Chloride: 107 mmol/L (ref 98–111)
Creatinine, Ser: 0.71 mg/dL (ref 0.44–1.00)
GFR, Estimated: >60 mL/min (ref >60)
Glucose, Bld: 104 mg/dL — ABNORMAL HIGH (ref 70–99)
Potassium: 3.5 mmol/L (ref 3.5–5.1)
Sodium: 137 mmol/L (ref 135–145)
Total Bilirubin: 0.2 mg/dL — ABNORMAL LOW (ref 0.3–1.2)
Total Protein: 7.3 g/dL (ref 6.5–8.1)

## 2021-08-30 LAB — CBC WITH DIFFERENTIAL/PLATELET
Abs Immature Granulocytes: 0.02 10*3/uL (ref 0.00–0.07)
Basophils Absolute: 0 10*3/uL (ref 0.0–0.1)
Basophils Relative: 0 %
Eosinophils Absolute: 0.1 10*3/uL (ref 0.0–0.5)
Eosinophils Relative: 1 %
HCT: 37.2 % (ref 36.0–46.0)
Hemoglobin: 12.8 g/dL (ref 12.0–15.0)
Immature Granulocytes: 0 %
Lymphocytes Relative: 28 %
Lymphs Abs: 2.3 10*3/uL (ref 0.7–4.0)
MCH: 29.7 pg (ref 26.0–34.0)
MCHC: 34.4 g/dL (ref 30.0–36.0)
MCV: 86.3 fL (ref 80.0–100.0)
Monocytes Absolute: 0.7 10*3/uL (ref 0.1–1.0)
Monocytes Relative: 9 %
Neutro Abs: 5.2 10*3/uL (ref 1.7–7.7)
Neutrophils Relative %: 62 %
Platelets: 313 10*3/uL (ref 150–400)
RBC: 4.31 MIL/uL (ref 3.87–5.11)
RDW: 13.1 % (ref 11.5–15.5)
WBC: 8.3 10*3/uL (ref 4.0–10.5)
nRBC: 0 % (ref 0.0–0.2)

## 2021-08-30 LAB — PREGNANCY, URINE: Preg Test, Ur: NEGATIVE

## 2021-08-30 MED ORDER — HYDROCODONE-ACETAMINOPHEN 5-325 MG PO TABS
1.0000 | ORAL_TABLET | Freq: Four times a day (QID) | ORAL | 0 refills | Status: DC | PRN
  Filled 2021-08-30: qty 10, 3d supply, fill #0

## 2021-08-30 MED ORDER — IOHEXOL 300 MG/ML  SOLN
100.0000 mL | Freq: Once | INTRAMUSCULAR | Status: AC | PRN
  Administered 2021-08-30: 100 mL via INTRAVENOUS

## 2021-08-30 NOTE — ED Triage Notes (Addendum)
Pt states dx with umbilical hernia a year ago, pain worse for past week.  Nauseous, vomited x1.  Last bm yesterday, normal per pt.  Denies difficulty with urination ?

## 2021-08-30 NOTE — Discharge Instructions (Addendum)
You do have a hernia at your bellybutton today.  Right now there is no bowel in it but when it starts hurting severely it is probably because bowel is sliding into it and getting pinched.  If that starts happening do what you did today.  Try to lay down relax and put pressure on the area to see if it will go back in.  If it does not and the pain becomes worse you need to return to the emergency room immediately.  Can try taking the pain pill initially to see if that helps you relax to get it back in. ?

## 2021-08-30 NOTE — ED Notes (Signed)
Patient transported to CT 

## 2021-08-30 NOTE — ED Provider Notes (Signed)
?[facility] EMERGENCY DEPARTMENT ?Provider Note ? ? ?CSN: [id number] ?Arrival date & time: 08/30/21  0825 ? ?  ? ?History ? ?Chief Complaint  ?Patient presents with  ? Abdominal Pain  ? ? ?Mary Peters is a 31 y.o. female. ? ?Patient is a 31 year old female with no significant medical problems presenting today with recurrent abdominal pain.  She reports about a year ago she was diagnosed with abdominal hernia but reports more recently she has been getting abdominal pain frequently and the hernia will swell and become extremely tender causing her to vomit.  She reports with any coughing, lifting or straining it becomes large and painful.  She reports all night she was awake due to the pain and she applied pressure and finally it improved this morning.  She is concerned that she is going to lose her job because she is continually having to call out because her abdomen will hurt severely.  She has not seen anybody about this and has had no prior surgeries.  She reports now there is just a mild ache in her upper abdomen but denies significant pain around the umbilicus. ? ?The history is provided by the patient.  ?Abdominal Pain ? ?  ? ?Home Medications ?Prior to Admission medications   ?Medication Sig Start Date End Date Taking? Authorizing Provider  ?HYDROcodone-acetaminophen (NORCO/VICODIN) 5-325 MG tablet Take 1 tablet by mouth every 6 (six) hours as needed for severe pain (only take if you start having severe abdominal pain). 08/30/21  Yes Gwyneth SproutPlunkett, Rayen Palen, MD  ?promethazine (PHENERGAN) 25 MG tablet Take 1 tablet (25 mg total) by mouth every 6 (six) hours as needed for nausea or vomiting. 11/15/20   Terrilee FilesButler, Michael C, MD  ?   ? ?Allergies    ?Haloperidol   ? ?Review of Systems   ?Review of Systems  ?Gastrointestinal:  Positive for abdominal pain.  ? ?Physical Exam ?Updated Vital Signs ?BP 107/72 (BP Location: Right Arm)   Pulse 87   Temp 98.3 ?F (36.8 ?C) (Oral)   Resp 18   Ht 5' (1.524 m)   Wt 51.3  kg   SpO2 100%   BMI 22.07 kg/m?  ?Physical Exam ?Vitals and nursing note reviewed.  ?Constitutional:   ?   General: She is not in acute distress. ?   Appearance: She is well-developed.  ?   Comments: Tearful on exam  ?HENT:  ?   Head: Normocephalic and atraumatic.  ?Eyes:  ?   Pupils: Pupils are equal, round, and reactive to light.  ?Cardiovascular:  ?   Rate and Rhythm: Normal rate and regular rhythm.  ?   Heart sounds: Normal heart sounds. No murmur heard. ?  No friction rub.  ?Pulmonary:  ?   Effort: Pulmonary effort is normal.  ?   Breath sounds: Normal breath sounds. No wheezing or rales.  ?Abdominal:  ?   General: Bowel sounds are normal. There is no distension.  ?   Palpations: Abdomen is soft.  ?   Tenderness: There is abdominal tenderness in the periumbilical area. There is no guarding or rebound.  ?   Hernia: A hernia is present. Hernia is present in the umbilical area and ventral area.  ?   Comments: Patient has a visible hernia in the umbilicus and above which could be a ventral hernia.  It is soft and partially reducible however unclear if it fully reduces  ?Musculoskeletal:     ?   General: No tenderness. Normal range of motion.  ?  Comments: No edema  ?Skin: ?   General: Skin is warm and dry.  ?   Findings: No rash.  ?Neurological:  ?   Mental Status: She is alert and oriented to person, place, and time.  ?   Cranial Nerves: No cranial nerve deficit.  ?Psychiatric:     ?   Behavior: Behavior normal.  ? ? ?ED Results / Procedures / Treatments   ?Labs ?(all labs ordered are listed, but only abnormal results are displayed) ?Labs Reviewed  ?COMPREHENSIVE METABOLIC PANEL - Abnormal; Notable for the following components:  ?    Result Value  ? Glucose, Bld 104 (*)   ? Total Bilirubin 0.2 (*)   ? All other components within normal limits  ?CBC WITH DIFFERENTIAL/PLATELET  ?PREGNANCY, URINE  ? ? ?EKG ?None ? ?Radiology ?CT ABDOMEN PELVIS W CONTRAST ? ?Result Date: 08/30/2021 ?CLINICAL DATA:  Ventral hernia.  EXAM: CT ABDOMEN AND PELVIS WITH CONTRAST TECHNIQUE: Multidetector CT imaging of the abdomen and pelvis was performed using the standard protocol following bolus administration of intravenous contrast. RADIATION DOSE REDUCTION: This exam was performed according to the departmental dose-optimization program which includes automated exposure control, adjustment of the mA and/or kV according to patient size and/or use of iterative reconstruction technique. CONTRAST:  OMNIPAQUE IOHEXOL 300 MG/ML  SOLN COMPARISON:  Multiple priors, most recent August 04, 2020 FINDINGS: Lower chest: No acute abnormality. Hepatobiliary: No focal liver abnormality is seen. No gallstones, gallbladder wall thickening, or biliary dilatation. Pancreas: Unremarkable. No pancreatic ductal dilatation or surrounding inflammatory changes. Spleen: Normal in size without focal abnormality. Adrenals/Urinary Tract: Bilateral adrenal glands are unremarkable. No hydronephrosis or nephrolithiasis. Unchanged low-attenuation lesion of the interpolar region of the left kidney, present on prior exams dating back to Sep 05, 2017, likely a simple cyst or prominent calyx. Stomach/Bowel: Stomach is within normal limits. Appendix appears normal. No evidence of bowel wall thickening, distention, or inflammatory changes. Vascular/Lymphatic: No significant vascular findings are present. No enlarged abdominal or pelvic lymph nodes. Reproductive: Uterus is unremarkable. Bilateral adnexal cysts measuring 1.6 cm on the right and 2.3 cm on the left, likely physiologic with no further follow-up needed. Other: Small fat containing umbilical hernia. No free fluid or free air seen in the abdomen and pelvis. Musculoskeletal: No acute or significant osseous findings. IMPRESSION: 1. No acute findings in the abdomen or pelvis. 2. Small fat containing umbilical hernia. Electronically Signed   By: Allegra Lai M.D.   On: 08/30/2021 10:23   ? ?Procedures ?Procedures   ? ? ?Medications Ordered in ED ?Medications  ?iohexol (OMNIPAQUE) 300 MG/ML solution 100 mL (100 mLs Intravenous Contrast Given 08/30/21 0953)  ? ? ?ED Course/ Medical Decision Making/ A&P ?  ?                        ?Medical Decision Making ?Amount and/or Complexity of Data Reviewed ?External Data Reviewed: notes. ?Labs: ordered. Decision-making details documented in ED Course. ?Radiology: ordered and independent interpretation performed. Decision-making details documented in ED Course. ? ?Risk ?Prescription drug management. ? ? ?Patient is a 31 year old female presenting today with complaint of abdominal pain.  Patient does have a ventral/umbilical hernia present on exam that is partially reducible but unclear if it will fully reduce.  Based on patient's history it sounds that if she is intermittently incarcerating her hernia and feel that she is most likely going to need surgical repair.  We will do a CT today to ensure no  evidence of bowel injury as she has been vomiting throughout the night and had severe abdominal pain. ? ?10:48 AM ?I independently interpreted patient's labs today with a normal CBC and CMP.  I independently visualized and interpreted patient's CT today which does show evidence of umbilical hernia but does not appear to have bowel obstruction or incarceration.  Radiology reports negative except for fat-containing umbilical hernia.  Based on patient's symptoms it sounds like she is intermittently incarcerating her umbilical hernia.  She is not incarcerated at this time and feel that she is stable for discharge and does not require admission however discussed with her follow-up with general surgery for repair.  Also discussed using an abdominal binder of some sort to help prevent it from coming out.  Patient does not currently have medical insurance at this time and will consult social worker to help her get plugged into general surgery or PCP.  Patient was given a note for work to avoid any  heavy lifting. ? ? ? ? ? ? ? ?Final Clinical Impression(s) / ED Diagnoses ?Final diagnoses:  ?Periumbilical abdominal pain  ?Umbilical hernia without obstruction and without gangrene  ? ? ?Rx / DC Orders ?ED Discharge [place]

## 2021-08-30 NOTE — Telephone Encounter (Signed)
RNCM spoke with patient regarding TOC consult for PCP needs. Patient reports not having insurance, just has the family planning Medicaid, needs PCP and is available for an appt anytime.  ? ?RNCM awaiting appointment response from [facility].  ? ? ? ?TOC will continue to follow.    ?

## 2021-08-30 NOTE — Telephone Encounter (Signed)
RNCM spoke with patient re: [facility] consult for PCP needs. See below for appt details. Patient verbalized understanding. ? ? ?New PCP appt scheduled for Monday, 5/1 at 1:45PM.  Patient needs to bring a $25.00 co-pay.  If she does not have it she can still come to the appointment.  [facility] give her financial paperwork to apply for the [employer] assistance. Bring list of medications or AVS. ? ?No additional TOC needs at this time. ?

## 2021-09-05 ENCOUNTER — Encounter: Payer: Medicaid Other | Admitting: Internal Medicine

## 2021-09-08 ENCOUNTER — Other Ambulatory Visit (HOSPITAL_BASED_OUTPATIENT_CLINIC_OR_DEPARTMENT_OTHER): Payer: Self-pay

## 2021-09-21 ENCOUNTER — Emergency Department (HOSPITAL_BASED_OUTPATIENT_CLINIC_OR_DEPARTMENT_OTHER): Payer: Self-pay

## 2021-09-21 ENCOUNTER — Encounter (HOSPITAL_BASED_OUTPATIENT_CLINIC_OR_DEPARTMENT_OTHER): Payer: Self-pay

## 2021-09-21 ENCOUNTER — Other Ambulatory Visit: Payer: Self-pay

## 2021-09-21 ENCOUNTER — Emergency Department (HOSPITAL_BASED_OUTPATIENT_CLINIC_OR_DEPARTMENT_OTHER)
Admission: EM | Admit: 2021-09-21 | Discharge: 2021-09-21 | Disposition: A | Discharge status: Home / Self Care | Payer: Self-pay | Attending: Emergency Medicine | Admitting: Emergency Medicine

## 2021-09-21 DIAGNOSIS — K429 Umbilical hernia without obstruction or gangrene: Secondary | ICD-10-CM | POA: Insufficient documentation

## 2021-09-21 HISTORY — DX: Umbilical hernia without obstruction or gangrene: K42.9

## 2021-09-21 LAB — COMPREHENSIVE METABOLIC PANEL
ALT: 26 U/L (ref 0–44)
AST: 21 U/L (ref 15–41)
Albumin: 3.9 g/dL (ref 3.5–5.0)
Alkaline Phosphatase: 56 U/L (ref 38–126)
Anion gap: 6 (ref 5–15)
BUN: 12 mg/dL (ref 6–20)
CO2: 22 mmol/L (ref 22–32)
Calcium: 9 mg/dL (ref 8.9–10.3)
Chloride: 107 mmol/L (ref 98–111)
Creatinine, Ser: 0.83 mg/dL (ref 0.44–1.00)
GFR, Estimated: >60 mL/min (ref >60)
Glucose, Bld: 100 mg/dL — ABNORMAL HIGH (ref 70–99)
Potassium: 3.7 mmol/L (ref 3.5–5.1)
Sodium: 135 mmol/L (ref 135–145)
Total Bilirubin: 0.3 mg/dL (ref 0.3–1.2)
Total Protein: 7.2 g/dL (ref 6.5–8.1)

## 2021-09-21 LAB — URINALYSIS, ROUTINE W REFLEX MICROSCOPIC
Bilirubin Urine: NEGATIVE
Glucose, UA: NEGATIVE mg/dL
Hgb urine dipstick: NEGATIVE
Ketones, ur: NEGATIVE mg/dL
Nitrite: NEGATIVE
Protein, ur: NEGATIVE mg/dL
Specific Gravity, Urine: 1.02 (ref 1.005–1.030)
pH: 7.5 (ref 5.0–8.0)

## 2021-09-21 LAB — URINALYSIS, MICROSCOPIC (REFLEX): RBC / HPF: NONE SEEN RBC/hpf (ref 0–5)

## 2021-09-21 LAB — CBC
HCT: 38.1 % (ref 36.0–46.0)
Hemoglobin: 12.9 g/dL (ref 12.0–15.0)
MCH: 29.3 pg (ref 26.0–34.0)
MCHC: 33.9 g/dL (ref 30.0–36.0)
MCV: 86.4 fL (ref 80.0–100.0)
Platelets: 387 10*3/uL (ref 150–400)
RBC: 4.41 MIL/uL (ref 3.87–5.11)
RDW: 13.2 % (ref 11.5–15.5)
WBC: 9.3 10*3/uL (ref 4.0–10.5)
nRBC: 0 % (ref 0.0–0.2)

## 2021-09-21 LAB — PREGNANCY, URINE: Preg Test, Ur: NEGATIVE

## 2021-09-21 LAB — LIPASE, BLOOD: Lipase: 35 U/L (ref 11–51)

## 2021-09-21 MED ORDER — HYDROCODONE-ACETAMINOPHEN 5-325 MG PO TABS: 2.0000 | ORAL_TABLET | ORAL | 0 refills | Status: AC | PRN

## 2021-09-21 MED ORDER — IOHEXOL 300 MG/ML  SOLN
100.0000 mL | Freq: Once | INTRAMUSCULAR | Status: AC | PRN
  Administered 2021-09-21: 100 mL via INTRAVENOUS

## 2021-09-21 MED ORDER — FENTANYL CITRATE PF 50 MCG/ML IJ SOSY
25.0000 ug | PREFILLED_SYRINGE | Freq: Once | INTRAMUSCULAR | Status: AC
  Administered 2021-09-21: 25 ug via INTRAVENOUS
  Filled 2021-09-21: qty 1

## 2021-09-21 MED ORDER — FENTANYL CITRATE PF 50 MCG/ML IJ SOSY
50.0000 ug | PREFILLED_SYRINGE | Freq: Once | INTRAMUSCULAR | Status: AC
  Administered 2021-09-21: 50 ug via INTRAVENOUS
  Filled 2021-09-21: qty 1

## 2021-09-21 MED ORDER — PROMETHAZINE HCL 25 MG PO TABS: 25.0000 mg | ORAL_TABLET | Freq: Four times a day (QID) | ORAL | 0 refills | Status: AC | PRN

## 2021-09-21 MED ORDER — SODIUM CHLORIDE 0.9 % IV BOLUS
1000.0000 mL | Freq: Once | INTRAVENOUS | Status: AC
  Administered 2021-09-21: 1000 mL via INTRAVENOUS

## 2021-09-21 NOTE — Discharge Instructions (Addendum)
You were seen in the emergency department for abdominal pain related to your hernia. ? ?As we discussed your CT scan showed that it slightly more swollen than it was before.  Your hernia is fat-containing, which means that it does not contain any of your intestines. ? ?I prescribing you pain medication and nausea medication.  I have sent a referral to one of the surgeons at [facility] surgery.  If you do not hear from them tomorrow, please call the following day to make an appointment. ? ?Continue to monitor how you're doing and return to the ER for new or worsening symptoms.  ?

## 2021-09-21 NOTE — ED Triage Notes (Signed)
Pt presents with an umbilical hernia that has suddenly became hard, painful, and tender to touch x 3 days. Pt with normal BM today.  ?

## 2021-09-21 NOTE — ED Notes (Signed)
Discharge instructions reviewed. Verbalizes understanding and denies further questions regarding medications and follow up. Pharmacy verified with pt. Alert and ambulatory.  ?

## 2021-09-21 NOTE — ED Provider Notes (Signed)
?[facility] EMERGENCY DEPARTMENT ?Provider Note ? ? ?CSN: [id number] ?Arrival date & time: 09/21/21  1850 ? ?  ? ?History ? ?Chief Complaint  ?Patient presents with  ? Hernia  ? ? ?Mary Peters is a 31 y.o. female who presents the emergency department complaining of abdominal pain.  Patient states that she was previously diagnosed with an umbilical hernia 1 year ago.  She has had episodes where the pain has been uncontrollable.  She reports that for the past 3 days the area has become harder, more painful and tender to the touch.  She is also been vomiting profusely.  States she had a normal bowel movement today.  She has been having to leave work due to the pain. ? ?HPI ? ?  ? ?Home Medications ?Prior to Admission medications   ?Medication Sig Start Date End Date Taking? Authorizing Provider  ?HYDROcodone-acetaminophen (NORCO/VICODIN) 5-325 MG tablet Take 2 tablets by mouth every 4 (four) hours as needed. 09/21/21  Yes Paddy Walthall T, PA-C  ?promethazine (PHENERGAN) 25 MG tablet Take 1 tablet (25 mg total) by mouth every 6 (six) hours as needed for nausea or vomiting. 09/21/21  Yes Bianca Vester T, PA-C  ?   ? ?Allergies    ?Haloperidol   ? ?Review of Systems   ?Review of Systems  ?Constitutional:  Positive for chills.  ?Respiratory:  Negative for shortness of breath.   ?Cardiovascular:  Negative for chest pain.  ?Gastrointestinal:  Positive for abdominal pain, nausea and vomiting. Negative for diarrhea.  ?Genitourinary:  Negative for dysuria.  ?All other systems reviewed and are negative. ? ?Physical Exam ?Updated Vital Signs ?BP 126/75 (BP Location: Left Arm)   Pulse 99   Temp 99.3 ?F (37.4 ?C) (Oral)   Resp 16   Ht 5' (1.524 m)   Wt 54.4 kg   LMP 09/04/2021   SpO2 97%   BMI 23.44 kg/m?  ?Physical Exam ?Vitals and nursing note reviewed.  ?Constitutional:   ?   Appearance: Normal appearance.  ?HENT:  ?   Head: Normocephalic and atraumatic.  ?Eyes:  ?   Conjunctiva/sclera: Conjunctivae  normal.  ?Cardiovascular:  ?   Rate and Rhythm: Normal rate and regular rhythm.  ?Pulmonary:  ?   Effort: Pulmonary effort is normal. No respiratory distress.  ?   Breath sounds: Normal breath sounds.  ?Abdominal:  ?   General: There is no distension.  ?   Palpations: Abdomen is soft.  ?   Tenderness: There is generalized abdominal tenderness.  ?   Hernia: A hernia is present. Hernia is present in the ventral area.  ? ? ?   Comments: Hernia noted, no overlying skin changes.  Very tender to touch.  Soft and partially reducible.  Generalized abdominal pain to palpation.  ?Skin: ?   General: Skin is warm and dry.  ?Neurological:  ?   General: No focal deficit present.  ?   Mental Status: She is alert.  ? ? ?ED Results / Procedures / Treatments   ?Labs ?(all labs ordered are listed, but only abnormal results are displayed) ?Labs Reviewed  ?COMPREHENSIVE METABOLIC PANEL - Abnormal; Notable for the following components:  ?    Result Value  ? Glucose, Bld 100 (*)   ? All other components within normal limits  ?URINALYSIS, ROUTINE W REFLEX MICROSCOPIC - Abnormal; Notable for the following components:  ? APPearance CLOUDY (*)   ? Leukocytes,Ua TRACE (*)   ? All other components within normal limits  ?URINALYSIS,  MICROSCOPIC (REFLEX) - Abnormal; Notable for the following components:  ? Bacteria, UA MANY (*)   ? All other components within normal limits  ?LIPASE, BLOOD  ?CBC  ?PREGNANCY, URINE  ? ? ?EKG ?None ? ?Radiology ?CT ABDOMEN PELVIS W CONTRAST ? ?Result Date: 09/21/2021 ?CLINICAL DATA:  Complicated hernia. EXAM: CT ABDOMEN AND PELVIS WITH CONTRAST TECHNIQUE: Multidetector CT imaging of the abdomen and pelvis was performed using the standard protocol following bolus administration of intravenous contrast. RADIATION DOSE REDUCTION: This exam was performed according to the departmental dose-optimization program which includes automated exposure control, adjustment of the mA and/or kV according to patient size and/or use of  iterative reconstruction technique. CONTRAST:  OMNIPAQUE IOHEXOL 300 MG/ML  SOLN COMPARISON:  CTs with contrast 08/30/2021, 11/15/2020 FINDINGS: Lower chest: No acute abnormality. Hepatobiliary: No significant liver abnormality is seen. Small area of periligamentous fat noted in the left hepatic lobe. No calcified gallstones, gallbladder wall thickening, or biliary dilatation. Pancreas: No focal abnormality. Spleen: No focal abnormality or splenomegaly. Adrenals/Urinary Tract: There is no adrenal mass. The right renal cortex is unremarkable. Again noted is a 1.2 cm low-density lesion of 75 Hounsfield units in the anterior aspect of the midpole left kidney seen as far back as the earliest studies in May in June of 2019, could be a hyperdense cyst or a complex lesion. Ultrasound characterization is recommended. The left kidney is otherwise unremarkable. There is no urinary stone or obstruction. There is no bladder thickening. Stomach/Bowel: No dilatation or wall thickening through the sigmoid segment including the appendix. Moderate stool retention ascending colon. There is diverticulosis without evidence of acute colitis or diverticulitis. The rectal wall does project thicker than previously however, warranting further evaluation. Vascular/Lymphatic: There is mild pelvic venous congestion. Otherwise no significant vascular findings are present. No enlarged abdominal or pelvic lymph nodes. Reproductive: The uterus is anteverted and intact. There is a 2 cm fundal subserosal fibroid on the right. The ovaries are follicular but not enlarged. There is dominant partially collapsed follicle of the left ovary of 1.8 cm and 18 Hounsfield units. Other: There is trace low-density fluid in the pelvic cul-de-sac probably physiologic. There is no free air, hemorrhage or abscess. There is an edematous umbilical fat hernia extending through a wall defect of 1.1 x 1.3 cm with the hernia sac measuring 3.8 x 3.0 x 5.2 cm,  previously 2.8 x 2.6 x 4.7 cm on the last CT. There is trace fluid in the hernia sac. Musculoskeletal: No acute or new osseous findings. There is symmetric bilateral non erosive sacroiliitis, chronic. IMPRESSION: 1. 3.8 x 3.0 x 5.2 cm edematous umbilical fat hernia through a 1.1 x 1.3 cm wide wall defect. There is trace fluid in the hernia sac and interval enlargement from last CT. There is no incarcerated bowel. 2. No other evidence of acute process in the abdomen or pelvis. 3. 1.2 cm low-density lesion in the left kidney above the usual density of fluid, could be a hyperdense cyst or complex lesion. Ultrasound characterization is recommended. 4. The rectal wall projecting thicker than previously which could be due to nondistention, proctitis or infiltrating disease. Further evaluation recommended. 5. Constipation and diverticulosis. 6. Pelvic venous congestion. 7. Trace pelvic cul-de-sac fluid likely physiologic given age but nonspecific. 8. Chronic symmetric nonerosive sacroiliitis. Electronically Signed   By: Almira Bar M.D.   On: 09/21/2021 21:08   ? ?Procedures ?Procedures  ? ? ?Medications Ordered in ED ?Medications  ?sodium chloride 0.9 % bolus 1,000 mL (0  mLs Intravenous Stopped 09/21/21 2217)  ?fentaNYL (SUBLIMAZE) injection 25 mcg (25 mcg Intravenous Given 09/21/21 1948)  ?iohexol (OMNIPAQUE) 300 MG/ML solution 100 mL (100 mLs Intravenous Contrast Given 09/21/21 2026)  ?fentaNYL (SUBLIMAZE) injection 50 mcg (50 mcg Intravenous Given 09/21/21 2141)  ? ? ?ED Course/ Medical Decision Making/ A&P ?  ?                        ?Medical Decision Making ?Amount and/or Complexity of Data Reviewed ?Labs: ordered. ? ?Risk ?Prescription drug management. ? ? ?This patient is a 31 y.o. female who presents to the ED for concern of abdominal pain at hernia site, this involves an extensive number of treatment options, and is a complaint that carries with it a high risk of complications and morbidity.  Differential  diagnosis includes obstruction, strangulation.  This is not an exhaustive differential. ? ?Past Medical History / Co-morbidities / Social History: ?Umbilical hernia ? ?Additional history: ?Chart reviewed. Pertinent results incl

## 2021-12-30 ENCOUNTER — Other Ambulatory Visit: Payer: Self-pay

## 2021-12-30 ENCOUNTER — Emergency Department (HOSPITAL_COMMUNITY)
Admission: EM | Admit: 2021-12-30 | Discharge: 2021-12-31 | Discharge status: Against Medical Advice | Payer: Medicaid Other | Attending: Emergency Medicine | Admitting: Emergency Medicine

## 2021-12-30 ENCOUNTER — Encounter (HOSPITAL_COMMUNITY): Payer: Self-pay | Admitting: *Deleted

## 2021-12-30 DIAGNOSIS — Z5329 Procedure and treatment not carried out because of patient's decision for other reasons: Secondary | ICD-10-CM | POA: Insufficient documentation

## 2021-12-30 DIAGNOSIS — F1210 Cannabis abuse, uncomplicated: Secondary | ICD-10-CM | POA: Insufficient documentation

## 2021-12-30 DIAGNOSIS — R45851 Suicidal ideations: Secondary | ICD-10-CM

## 2021-12-30 DIAGNOSIS — Y900 Blood alcohol level of less than 20 mg/100 ml: Secondary | ICD-10-CM | POA: Insufficient documentation

## 2021-12-30 DIAGNOSIS — K439 Ventral hernia without obstruction or gangrene: Secondary | ICD-10-CM | POA: Insufficient documentation

## 2021-12-30 NOTE — ED Triage Notes (Signed)
Pt states that she has pain in her abdomen at her umbilical hernia "for months". Last bowel movement yesterday. Also reports yesterday she was moving some things in to storage and felt burning areas on her left arm, and now has red areas going up her arm, has been putting rubbing alcohol on the areas without relief.   Pt is tearful in triage asking to talk to someone about her mental health, states she went to [facility] about 2-3 weeks ago about the same. Pt states she has been SI, plans to cut herself.

## 2021-12-31 LAB — COMPREHENSIVE METABOLIC PANEL
ALT: 21 U/L (ref 0–44)
AST: 20 U/L (ref 15–41)
Albumin: 3.9 g/dL (ref 3.5–5.0)
Alkaline Phosphatase: 55 U/L (ref 38–126)
Anion gap: 8 (ref 5–15)
BUN: <5 mg/dL — ABNORMAL LOW (ref 6–20)
CO2: 21 mmol/L — ABNORMAL LOW (ref 22–32)
Calcium: 9 mg/dL (ref 8.9–10.3)
Chloride: 109 mmol/L (ref 98–111)
Creatinine, Ser: 0.7 mg/dL (ref 0.44–1.00)
GFR, Estimated: >60 mL/min (ref >60)
Glucose, Bld: 106 mg/dL — ABNORMAL HIGH (ref 70–99)
Potassium: 3.9 mmol/L (ref 3.5–5.1)
Sodium: 138 mmol/L (ref 135–145)
Total Bilirubin: 0.7 mg/dL (ref 0.3–1.2)
Total Protein: 7 g/dL (ref 6.5–8.1)

## 2021-12-31 LAB — CBC
HCT: 38.6 % (ref 36.0–46.0)
Hemoglobin: 12.9 g/dL (ref 12.0–15.0)
MCH: 28.9 pg (ref 26.0–34.0)
MCHC: 33.4 g/dL (ref 30.0–36.0)
MCV: 86.4 fL (ref 80.0–100.0)
Platelets: 353 10*3/uL (ref 150–400)
RBC: 4.47 MIL/uL (ref 3.87–5.11)
RDW: 12.7 % (ref 11.5–15.5)
WBC: 7.4 10*3/uL (ref 4.0–10.5)
nRBC: 0 % (ref 0.0–0.2)

## 2021-12-31 LAB — RAPID URINE DRUG SCREEN, HOSP PERFORMED
Amphetamines: NOT DETECTED
Barbiturates: NOT DETECTED
Benzodiazepines: NOT DETECTED
Cocaine: NOT DETECTED
Opiates: NOT DETECTED
Tetrahydrocannabinol: POSITIVE — AB

## 2021-12-31 LAB — ACETAMINOPHEN LEVEL: Acetaminophen (Tylenol), Serum: <10 ug/mL — ABNORMAL LOW (ref 10–30)

## 2021-12-31 LAB — ETHANOL: Alcohol, Ethyl (B): <10 mg/dL (ref <10)

## 2021-12-31 LAB — SALICYLATE LEVEL: Salicylate Lvl: <7 mg/dL — ABNORMAL LOW (ref 7.0–30.0)

## 2021-12-31 LAB — I-STAT BETA HCG BLOOD, ED (MC, WL, AP ONLY): I-stat hCG, quantitative: <5 m[IU]/mL (ref <5)

## 2021-12-31 NOTE — ED Notes (Signed)
Pt stating she is uncomfortable and just wants to leave. Dr. Rodena Medin notified and he advised pt did not need to be IVC'd. He stated pt has capacity to make decisions and her SI is passive with no plan. He advised pt can leave AMA.   Encouraged pt to stay, but pt was adamant to leave. Pt offered resources of transportation and address to [facility] if pt changes mind. Pt declined resources.

## 2021-12-31 NOTE — ED Notes (Signed)
Pt sitting up on edge of bed. This RN introduced self to pt and oriented pt to department. Pt expressed feelings of fear and depression. Pt reports new homelessness and being under stress. Pt reports wanting to leave, but is agreeable to talk to psychiatry.

## 2021-12-31 NOTE — ED Provider Notes (Signed)
[facility] EMERGENCY DEPARTMENT Provider Note   CSN: [id number] Arrival date & time: 12/30/21  2336     History  Chief Complaint  Patient presents with   SI/abd pain    Mary Peters is a 31 y.o. female.  31 year old female with prior medical history as detailed below.  She presents with 2 complaints.  She complains of chronic periumbilical hernia pain.  This comes and goes.  This has been an ongoing issue for months.  She also complains of thoughts of self-harm.  She endorses mild suicidal ideation with a tentative plan to cut her wrist and bleed to death.  She reports recent loss of her housing.  She reports recent financial difficulties.  She reports difficulties with her transportation secondary to a malfunctioning car that she cannot afford to fix.  She reports that these issues become too much for her.  She reports contemplating suicide.  She has not yet been seen or established with outpatient psychiatry or counseling.  She denies prior suicide attempt in the past.  The history is provided by the patient and medical records.       Home Medications Prior to Admission medications   Medication Sig Start Date End Date Taking? Authorizing Provider  HYDROcodone-acetaminophen (NORCO/VICODIN) 5-325 MG tablet Take 2 tablets by mouth every 4 (four) hours as needed. 09/21/21   Roemhildt, Lorin T, PA-C  promethazine (PHENERGAN) 25 MG tablet Take 1 tablet (25 mg total) by mouth every 6 (six) hours as needed for nausea or vomiting. 09/21/21   Roemhildt, Lorin T, PA-C      Allergies    Haloperidol    Review of Systems   Review of Systems  All other systems reviewed and are negative.   Physical Exam Updated Vital Signs BP (!) 137/92 (BP Location: Left Arm)   Pulse 71   Temp 98.1 F (36.7 C) (Oral)   Resp 20   Ht 5' (1.524 m)   Wt 57.2 kg   SpO2 100%   BMI 24.61 kg/m  Physical Exam Vitals and nursing note reviewed.  Constitutional:      General: She  is not in acute distress.    Appearance: Normal appearance. She is well-developed.  HENT:     Head: Normocephalic and atraumatic.  Eyes:     Conjunctiva/sclera: Conjunctivae normal.     Pupils: Pupils are equal, round, and reactive to light.  Cardiovascular:     Rate and Rhythm: Normal rate and regular rhythm.     Heart sounds: Normal heart sounds.  Pulmonary:     Effort: Pulmonary effort is normal. No respiratory distress.     Breath sounds: Normal breath sounds.  Abdominal:     General: There is no distension.     Palpations: Abdomen is soft.     Tenderness: There is no abdominal tenderness.     Comments: Small easily reducible periumbilical hernia.  No evidence of incarceration on exam.  Musculoskeletal:        General: No deformity. Normal range of motion.     Cervical back: Normal range of motion and neck supple.  Skin:    General: Skin is warm and dry.  Neurological:     General: No focal deficit present.     Mental Status: She is alert and oriented to person, place, and time.     ED Results / Procedures / Treatments   Labs (all labs ordered are listed, but only abnormal results are displayed) Labs Reviewed  COMPREHENSIVE METABOLIC  PANEL - Abnormal; Notable for the following components:      Result Value   CO2 21 (*)    Glucose, Bld 106 (*)    BUN <5 (*)    All other components within normal limits  SALICYLATE LEVEL - Abnormal; Notable for the following components:   Salicylate Lvl <7.0 (*)    All other components within normal limits  ACETAMINOPHEN LEVEL - Abnormal; Notable for the following components:   Acetaminophen (Tylenol), Serum <10 (*)    All other components within normal limits  RAPID URINE DRUG SCREEN, HOSP PERFORMED - Abnormal; Notable for the following components:   Tetrahydrocannabinol POSITIVE (*)    All other components within normal limits  RESP PANEL BY RT-PCR (FLU A&B, COVID) ARPGX2  ETHANOL  CBC  I-STAT BETA HCG BLOOD, ED ([facility], [facility], [facility] ONLY)     EKG None  Radiology No results found.  Procedures Procedures    Medications Ordered in ED Medications - No data to display  ED Course/ Medical Decision Making/ A&P                           Medical Decision Making Amount and/or Complexity of Data Reviewed Labs: ordered.    Medical Screen Complete  This patient presented to the ED with complaint of suicidal ideation.  This complaint involves an extensive number of treatment options. The initial differential diagnosis includes, but is not limited to, mental health evaluation, metabolic abnormality, etc.  This presentation is: Acute, Chronic, Self-Limited, Previously Undiagnosed, Uncertain Prognosis, Complicated, Systemic Symptoms, and Threat to Life/Bodily Function  Patient is presenting with primary complaint of suicidal ideation with plan to cut wrist and bleed to death.  Patient will require psychiatric evaluation.  Medical screening is without evidence of acute medical pathology.  Patient is medically clear at this time for further psychiatric evaluation and treatment.  Final disposition dependent upon psychiatric plan of care.  Additional history obtained:  External records from outside sources obtained and reviewed including prior ED visits and prior Inpatient records.    Lab Tests:  I ordered and personally interpreted labs.  The pertinent results include: CBC, CMP, tox screen, acetaminophen, salicylate, EtOH, hCG   Problem List / ED Course:  Suicidal ideation   Reevaluation:  After the interventions noted above, I reevaluated the patient and found that they have: stayed the same   Disposition:  After consideration of the diagnostic results and the patients response to treatment, I feel that the patent would benefit from psychiatric evaluation.          Final Clinical Impression(s) / ED Diagnoses Final diagnoses:  Suicidal ideation    Rx / DC Orders ED Discharge Orders     None          Wynetta Fines, MD 12/31/21 [phone]

## 2022-08-21 ENCOUNTER — Encounter: Payer: Self-pay | Admitting: *Deleted

## 2022-09-30 IMAGING — CR DG HAND COMPLETE 3+V*R*
3 series · 3 of 3 positions shown · non-contrast
Comparison: None.

CLINICAL DATA: Right base of thumb pain.  No known injury.

EXAM:
RIGHT HAND - COMPLETE 3+ VIEW

[x hand pa right]
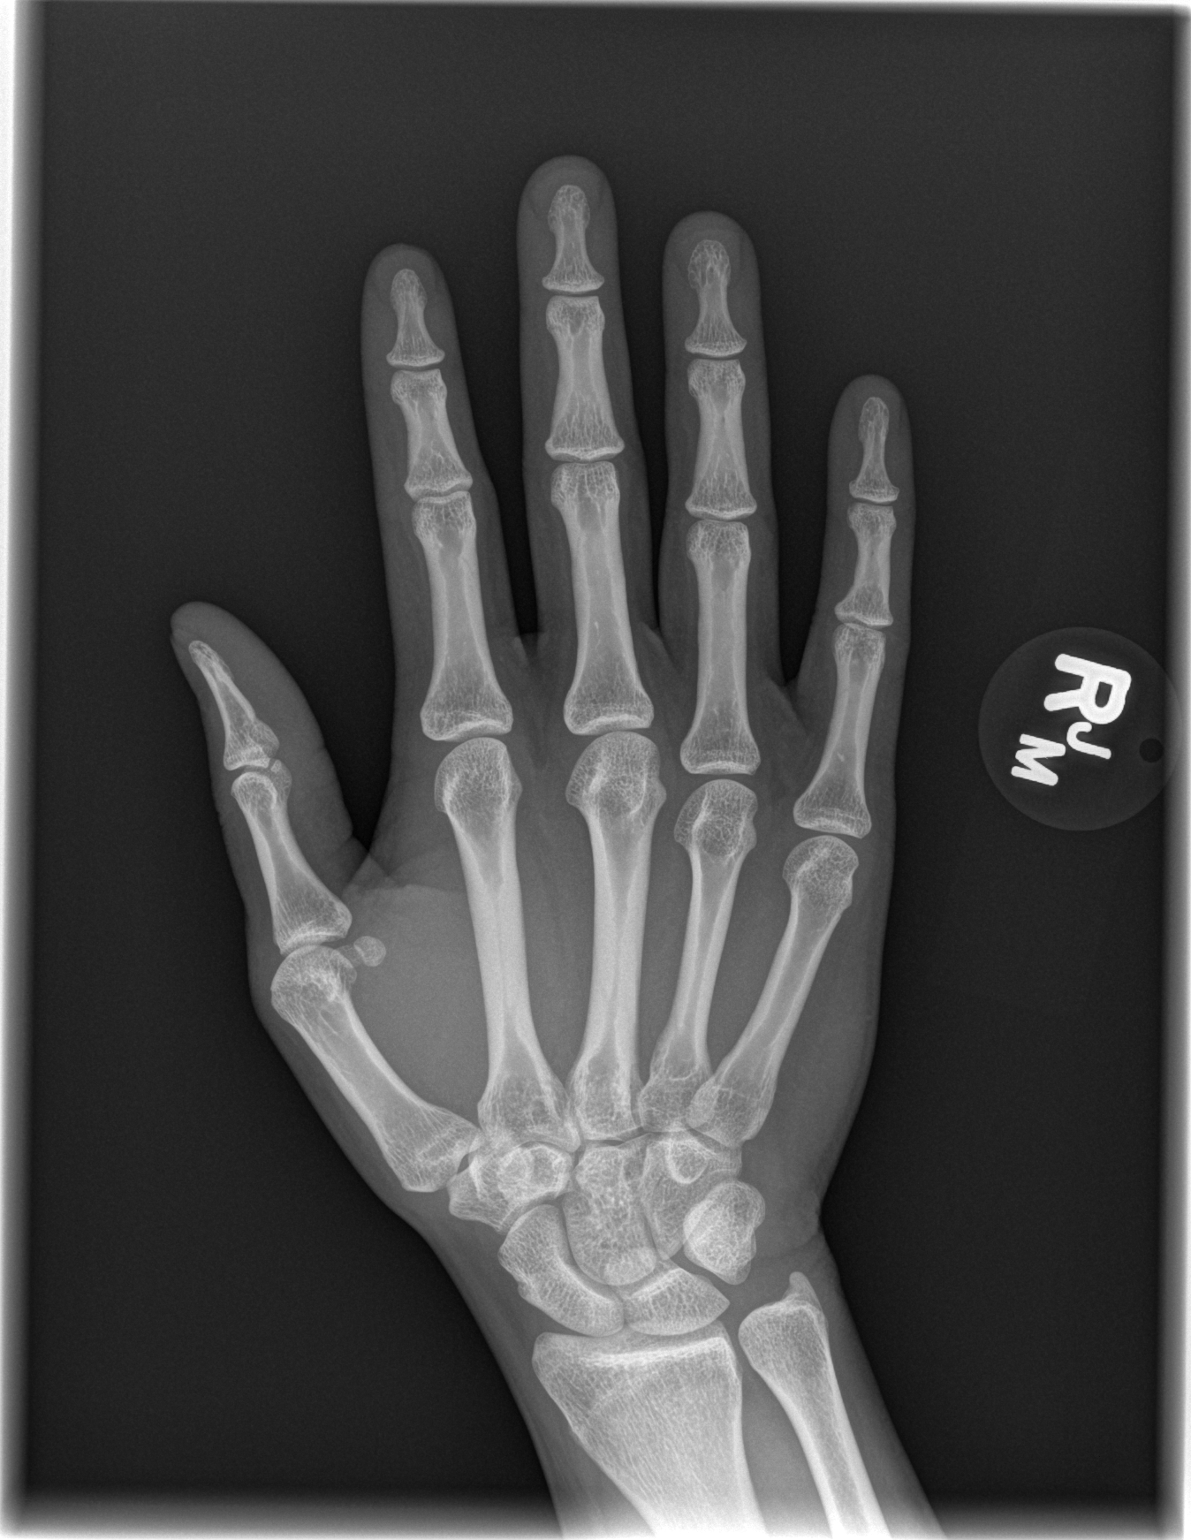

[x hand oblique right]
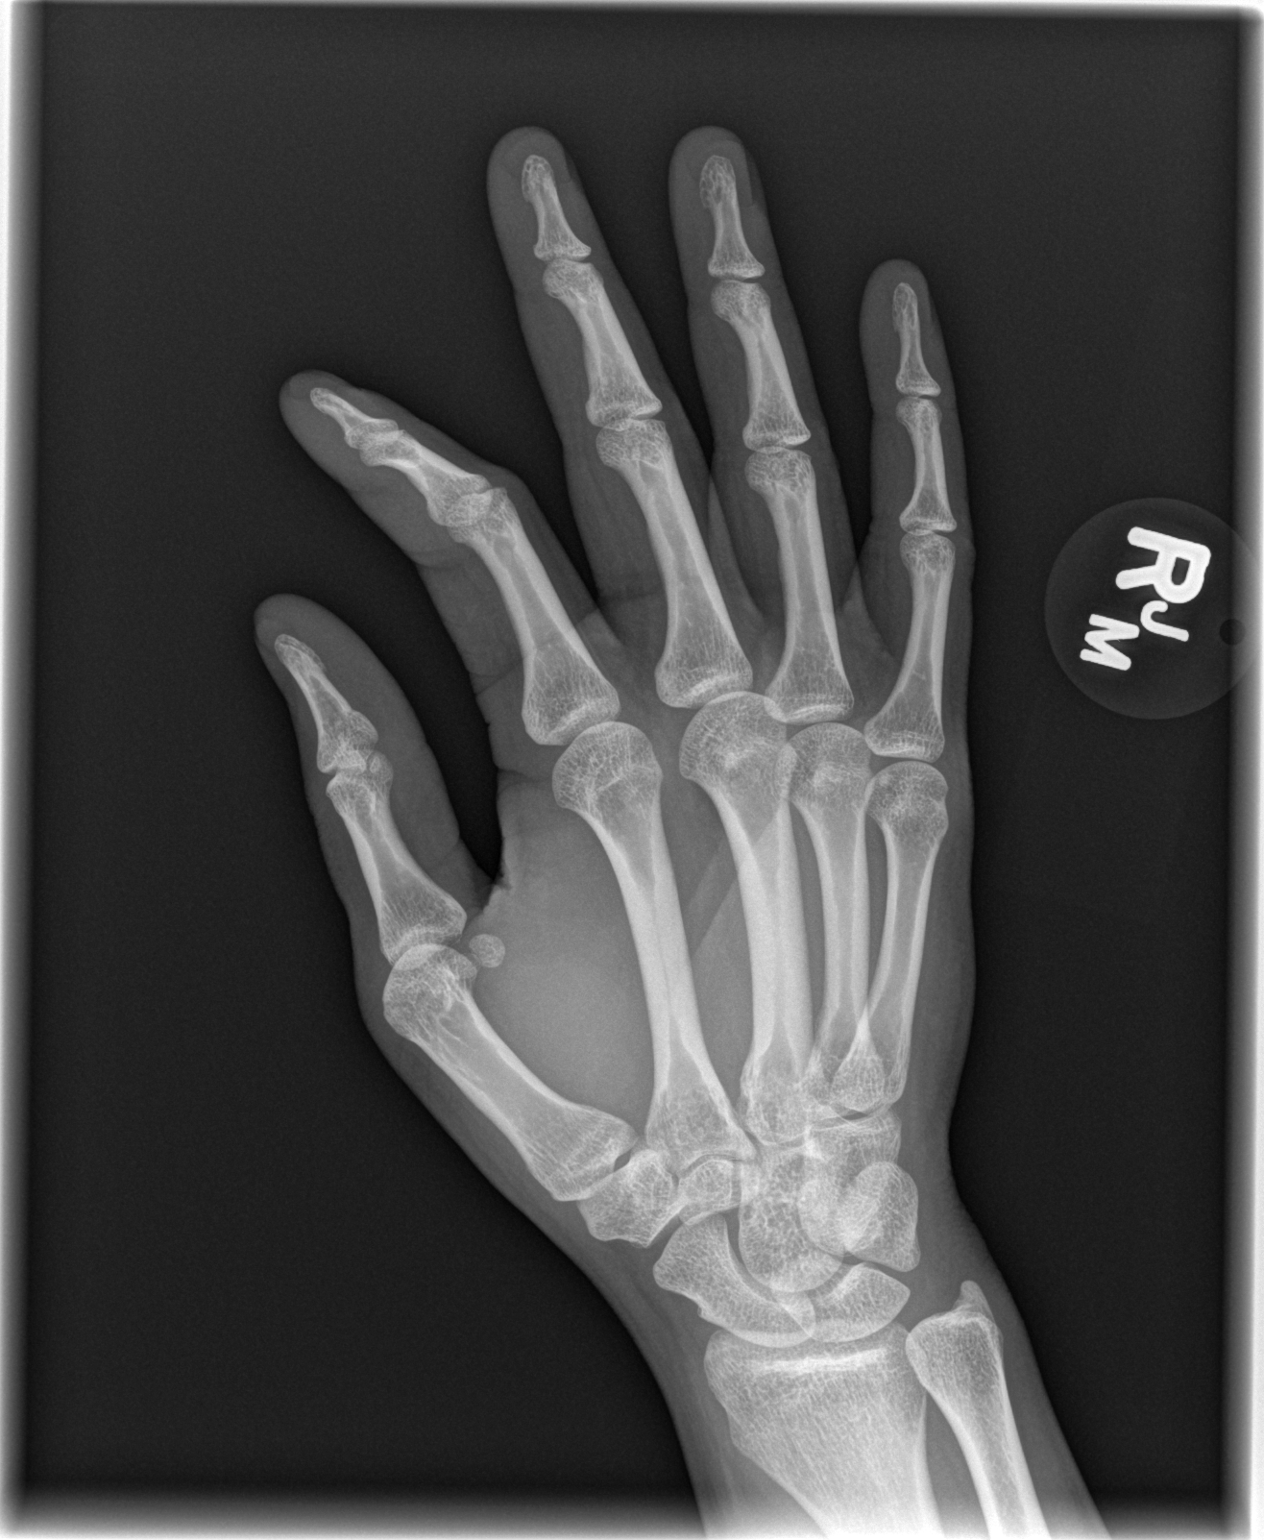

[x hand lat right]
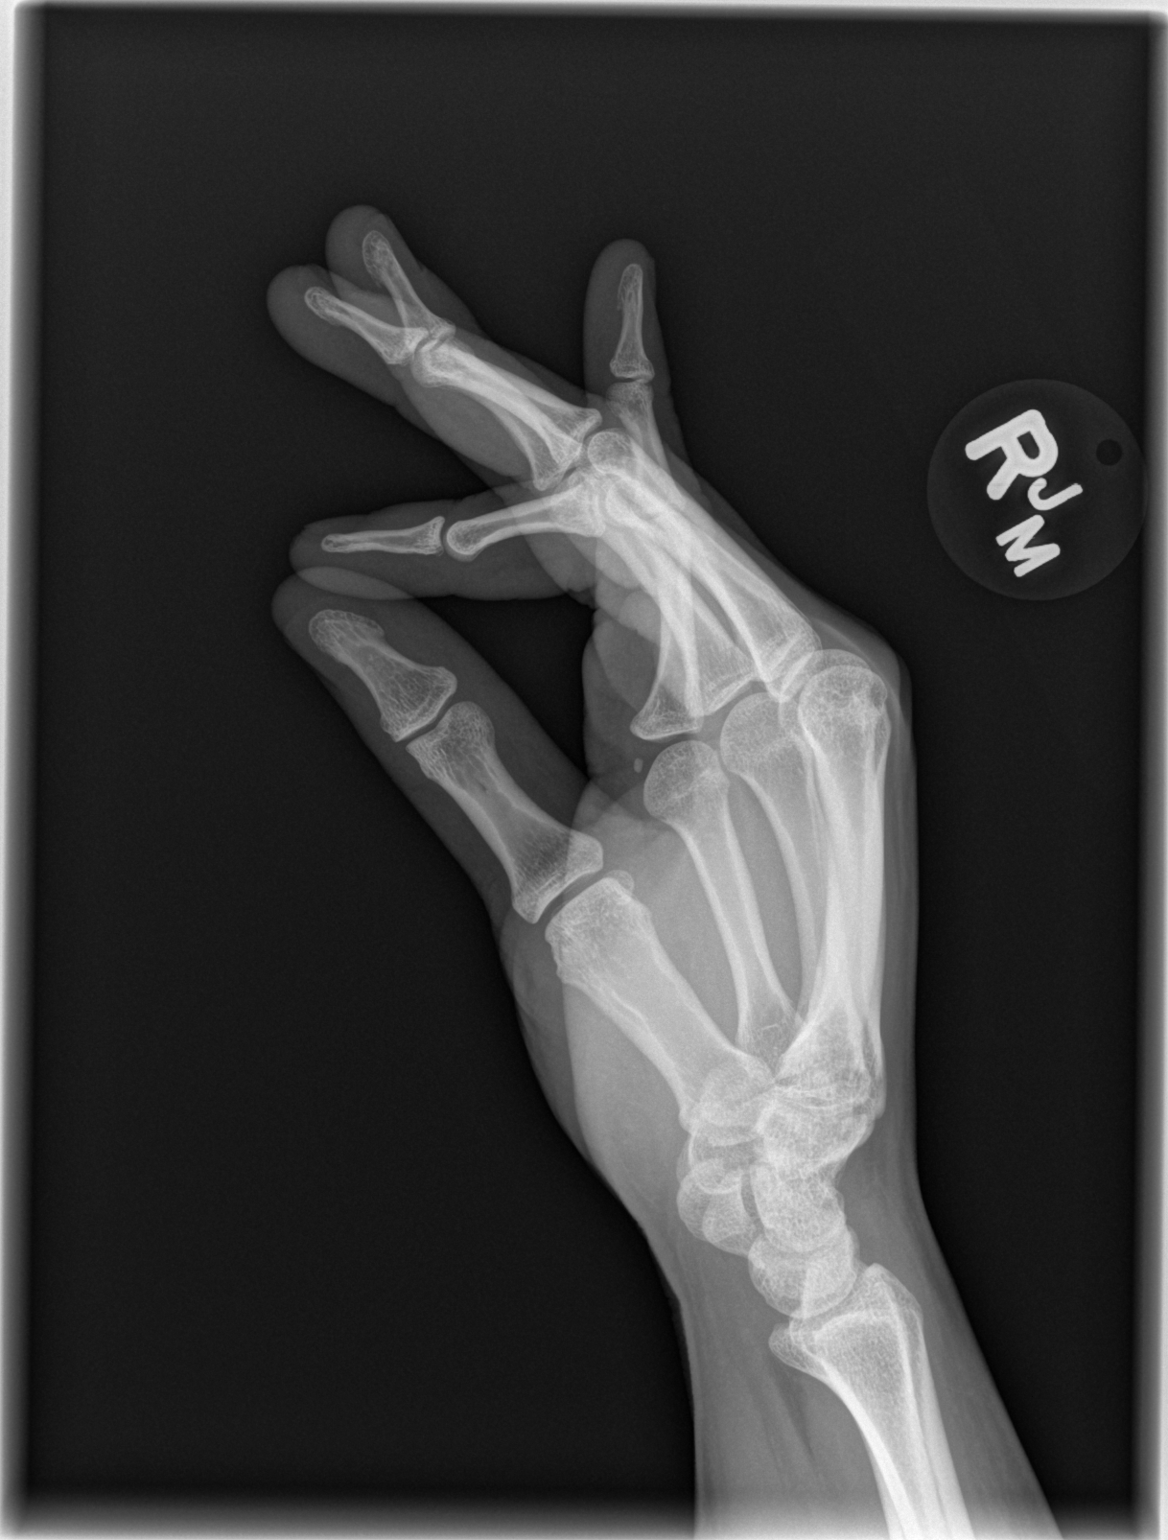

[3 of 3 positions shown; findings below may reference images not displayed]

FINDINGS: No acute fracture or dislocation. Normal bony mineralization. Joint
spaces are maintained. No bony erosion or periostitis. No evidence
of other focal bone abnormality. No abnormal soft tissue
mineralization. No focal soft tissue swelling.
IMPRESSION: Negative.

## 2023-12-14 IMAGING — CT CT ABD-PELV W/ CM
2 of 4 series · 14 of 46 positions shown, 16 images · IV contrast (Omnipaque)
Comparison: CTs with contrast 08/30/2021, 11/15/2020

CLINICAL DATA: Complicated hernia.

EXAM:
CT ABDOMEN AND PELVIS WITH CONTRAST
TECHNIQUE: Multidetector CT imaging of the abdomen and pelvis was performed
using the standard protocol following bolus administration of
intravenous contrast.

[Series 2: axial st · axial · 0.84mm/px · z∈[-780,-416]mm · 11 of 89 slices shown, 13 images]
[im 8/89  soft-tissue]
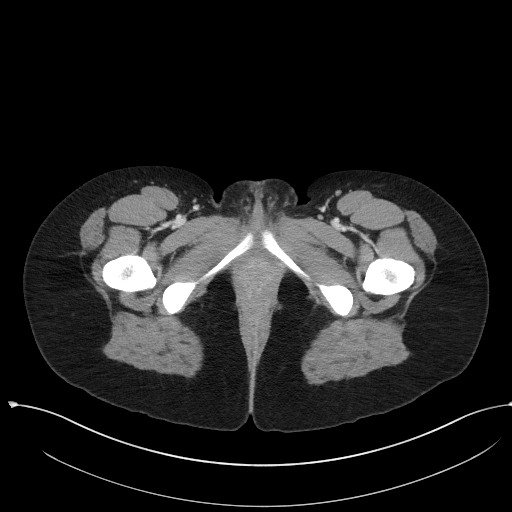
[im 8/89  bone]
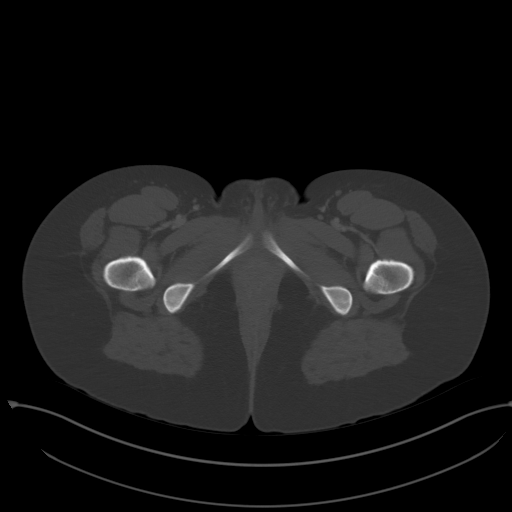
[im 15/89  soft-tissue]
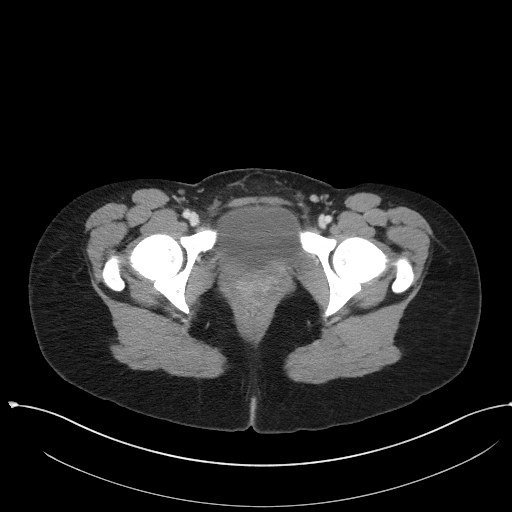
[im 23/89  soft-tissue]
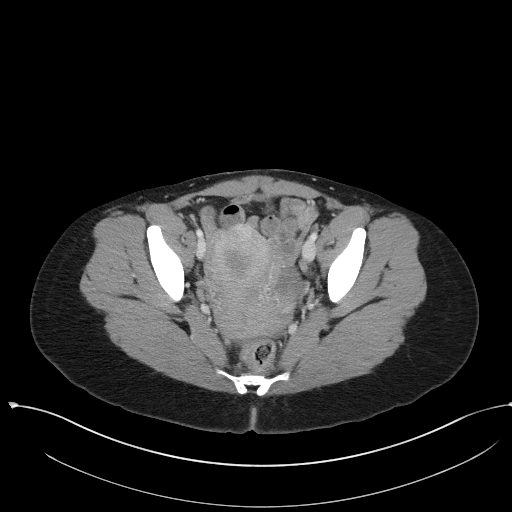
[im 30/89  soft-tissue]
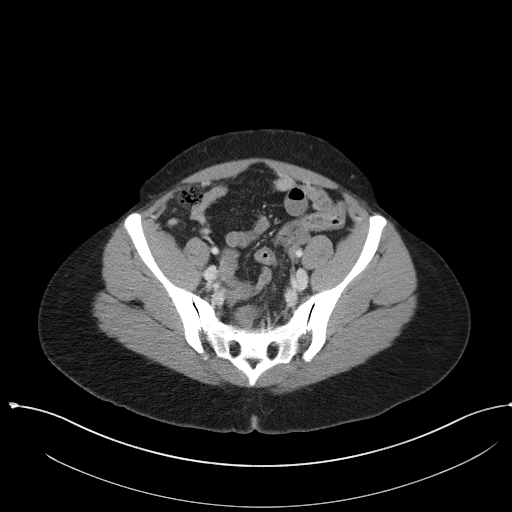
[im 37/89  soft-tissue]
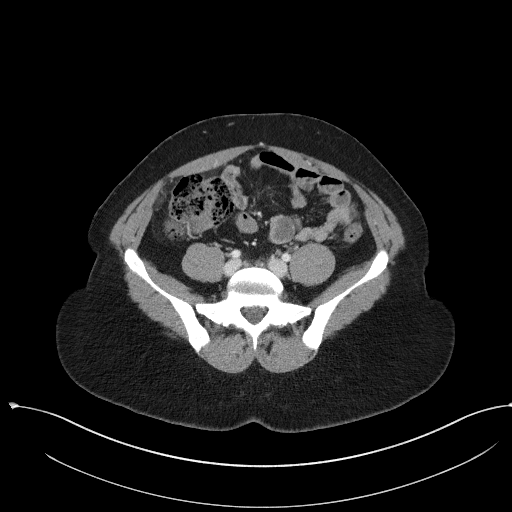
[im 45/89  soft-tissue]
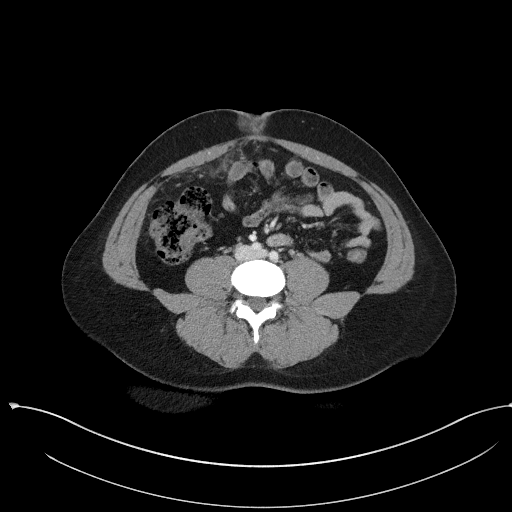
[im 52/89  soft-tissue]
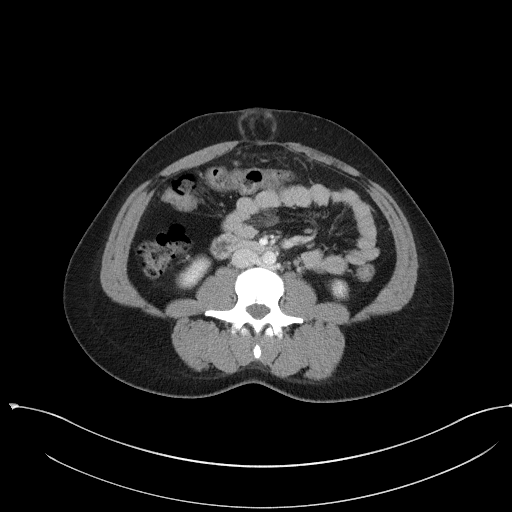
[im 59/89  soft-tissue]
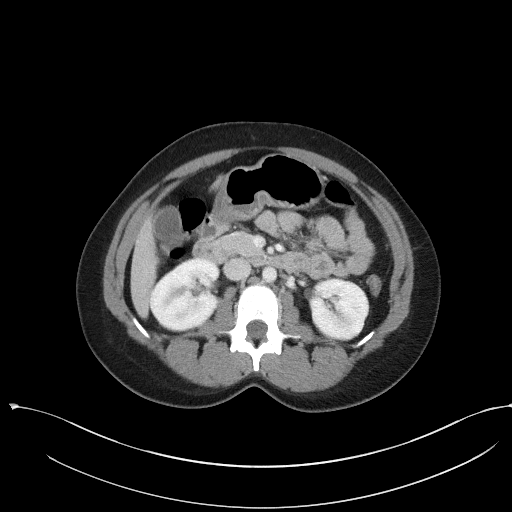
[im 67/89  soft-tissue]
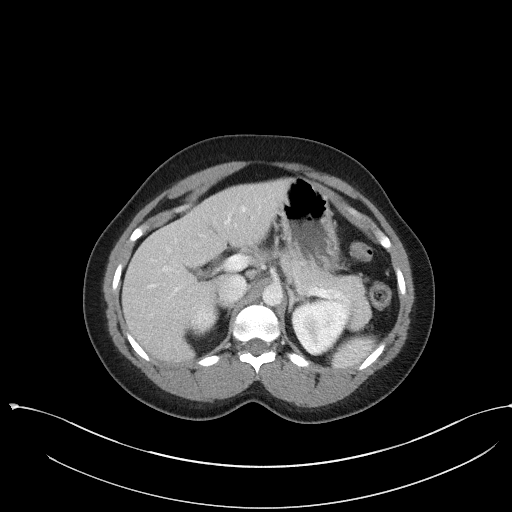
[im 67/89  bone]
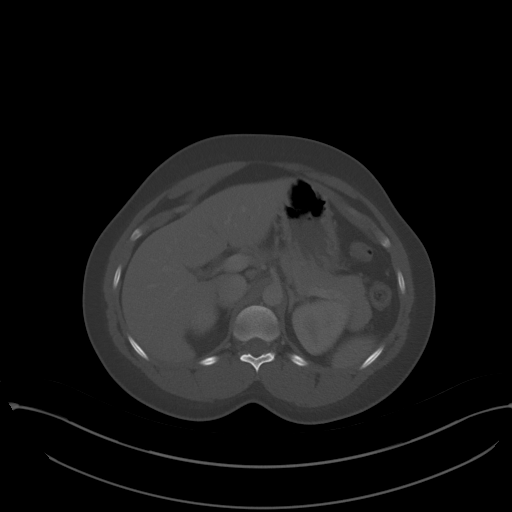
[im 74/89  soft-tissue]
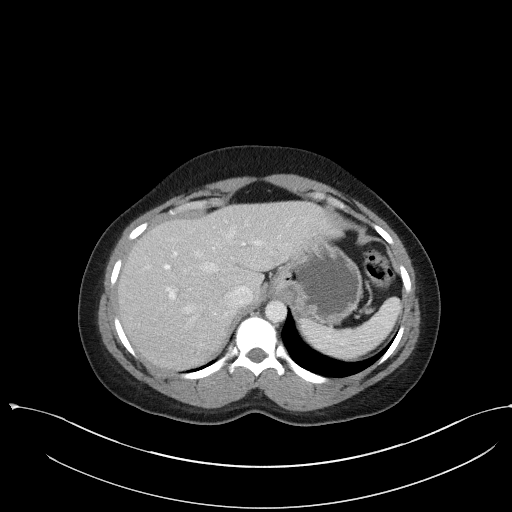
[im 81/89  soft-tissue]
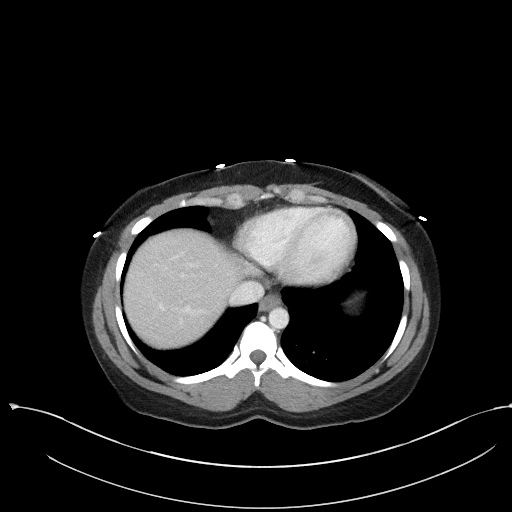

[Series 5: coronal st · coronal · 0.66mm/px · 3 of 101 slices shown]
[im 34/101  soft-tissue]
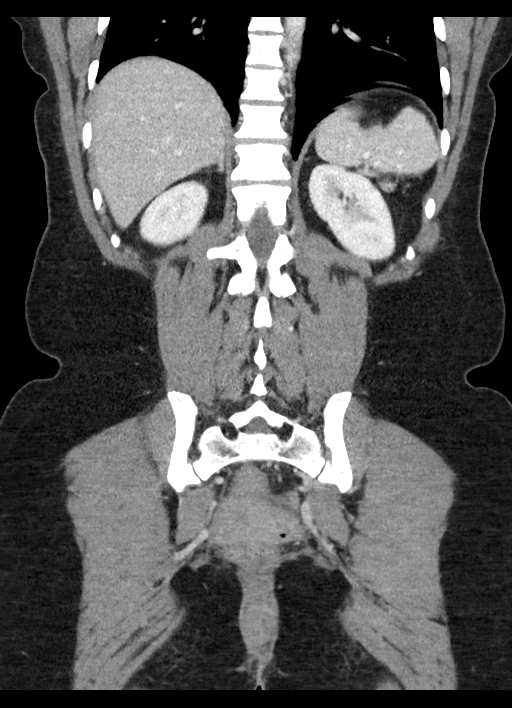
[im 45/101  soft-tissue]
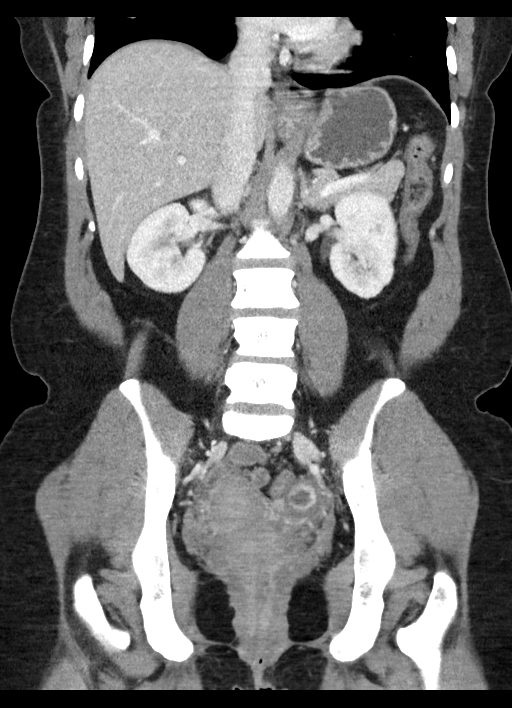
[im 56/101  soft-tissue]
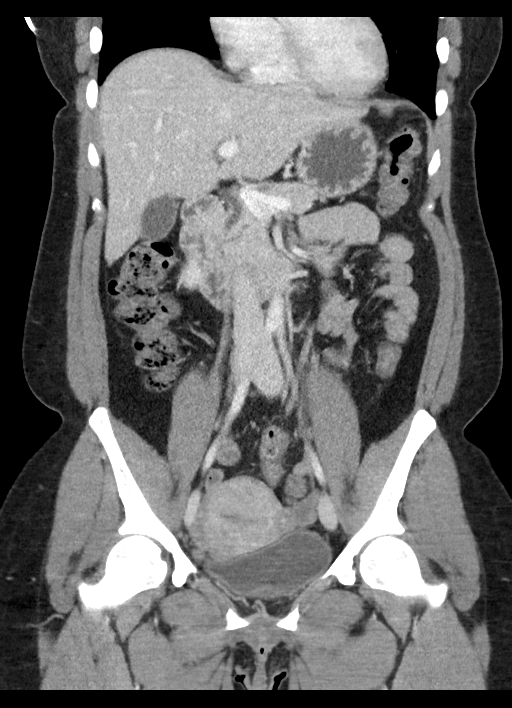

[14 of 46 positions shown; findings below may reference images not displayed]

RADIATION DOSE REDUCTION: This exam was performed according to the
departmental dose-optimization program which includes automated
exposure control, adjustment of the mA and/or kV according to
patient size and/or use of iterative reconstruction technique.

CONTRAST:  100mL OMNIPAQUE IOHEXOL 300 MG/ML  SOLN
FINDINGS: Lower chest: No acute abnormality.

Hepatobiliary: No significant liver abnormality is seen. Small area
of periligamentous fat noted in the left hepatic lobe. No calcified
gallstones, gallbladder wall thickening, or biliary dilatation.

Pancreas: No focal abnormality.

Spleen: No focal abnormality or splenomegaly.

Adrenals/Urinary Tract: There is no adrenal mass. The right renal
cortex is unremarkable. Again noted is a 1.2 cm low-density lesion
of 75 Hounsfield units in the anterior aspect of the midpole left
kidney seen as far back as the earliest studies [REDACTED] in Friday October, 2017, could be a hyperdense cyst or a complex lesion. Ultrasound
characterization is recommended.

The left kidney is otherwise unremarkable. There is no urinary stone
or obstruction. There is no bladder thickening.

Stomach/Bowel: No dilatation or wall thickening through the sigmoid
segment including the appendix. Moderate stool retention ascending
colon. There is diverticulosis without evidence of acute colitis or
diverticulitis. The rectal wall does project thicker than previously
however, warranting further evaluation.

Vascular/Lymphatic: There is mild pelvic venous congestion.
Otherwise no significant vascular findings are present. No enlarged
abdominal or pelvic lymph nodes.

Reproductive: The uterus is anteverted and intact. There is a 2 cm
fundal subserosal fibroid on the right. The ovaries are follicular
but not enlarged. There is dominant partially collapsed follicle of
the left ovary of 1.8 cm and 18 Hounsfield units.

Other: There is trace low-density fluid in the pelvic cul-de-sac
probably physiologic. There is no free air, hemorrhage or abscess.

There is an edematous umbilical fat hernia extending through a wall
defect of 1.1 x 1.3 cm with the hernia sac measuring 3.8 x 3.0 x
cm, previously 2.8 x 2.6 x 4.7 cm on the last CT. There is trace
fluid in the hernia sac.

Musculoskeletal: No acute or new osseous findings. There is
symmetric bilateral non erosive sacroiliitis, chronic.
IMPRESSION: 1. 3.8 x 3.0 x 5.2 cm edematous umbilical fat hernia through a 1.1 x
1.3 cm wide wall defect. There is trace fluid in the hernia sac and
interval enlargement from last CT. There is no incarcerated bowel.
2. No other evidence of acute process in the abdomen or pelvis.
[DATE] cm low-density lesion in the left kidney above the usual
density of fluid, could be a hyperdense cyst or complex lesion.
Ultrasound characterization is recommended.
4. The rectal wall projecting thicker than previously which could be
due to nondistention, proctitis or infiltrating disease. Further
evaluation recommended.
5. Constipation and diverticulosis.
6. Pelvic venous congestion.
7. Trace pelvic cul-de-sac fluid likely physiologic given age but
nonspecific.
8. Chronic symmetric nonerosive sacroiliitis.

## 2024-02-24 ENCOUNTER — Encounter (HOSPITAL_BASED_OUTPATIENT_CLINIC_OR_DEPARTMENT_OTHER): Payer: Self-pay | Admitting: Emergency Medicine

## 2024-02-24 ENCOUNTER — Emergency Department (HOSPITAL_BASED_OUTPATIENT_CLINIC_OR_DEPARTMENT_OTHER)
Admission: EM | Admit: 2024-02-24 | Discharge: 2024-02-24 | Discharge status: Against Medical Advice | Payer: Self-pay | Attending: Emergency Medicine | Admitting: Emergency Medicine

## 2024-02-24 DIAGNOSIS — Z5321 Procedure and treatment not carried out due to patient leaving prior to being seen by health care provider: Secondary | ICD-10-CM | POA: Insufficient documentation

## 2024-02-24 DIAGNOSIS — R300 Dysuria: Secondary | ICD-10-CM | POA: Insufficient documentation

## 2024-02-24 DIAGNOSIS — R110 Nausea: Secondary | ICD-10-CM | POA: Insufficient documentation

## 2024-02-24 DIAGNOSIS — N898 Other specified noninflammatory disorders of vagina: Secondary | ICD-10-CM | POA: Insufficient documentation

## 2024-02-24 LAB — URINALYSIS, ROUTINE W REFLEX MICROSCOPIC
Bilirubin Urine: NEGATIVE
Glucose, UA: NEGATIVE mg/dL
Hgb urine dipstick: NEGATIVE
Ketones, ur: NEGATIVE mg/dL
Leukocytes,Ua: NEGATIVE
Nitrite: NEGATIVE
Protein, ur: NEGATIVE mg/dL
Specific Gravity, Urine: 1.025 (ref 1.005–1.030)
pH: 6.5 (ref 5.0–8.0)

## 2024-02-24 LAB — PREGNANCY, URINE: Preg Test, Ur: NEGATIVE

## 2024-02-24 NOTE — ED Triage Notes (Signed)
 Pt c/o generalized lower abd pain x 1 wk; +nausea, dysuria; reports vaginal discharge

## 2024-02-24 NOTE — ED Notes (Signed)
 Pt refused blood draw

## 2024-02-24 NOTE — ED Notes (Signed)
Called in [place], 2nd call, no answer

## 2024-02-24 NOTE — ED Notes (Signed)
Called in [place], no answer.
